# Patient Record
Sex: Male | Born: 1965 | Race: Black or African American | Hispanic: No | Marital: Single | Smoking: Never smoker
Health system: Southern US, Community
[De-identification: ages and names within clinical notes are randomized; demographics above are authoritative.]

## PROBLEM LIST (undated history)

## (undated) DIAGNOSIS — E7800 Pure hypercholesterolemia, unspecified: Secondary | ICD-10-CM

## (undated) DIAGNOSIS — Z9289 Personal history of other medical treatment: Secondary | ICD-10-CM

## (undated) DIAGNOSIS — M109 Gout, unspecified: Secondary | ICD-10-CM

## (undated) DIAGNOSIS — I10 Essential (primary) hypertension: Secondary | ICD-10-CM

## (undated) HISTORY — PX: HERNIA REPAIR: SHX51

---

## 2008-02-21 ENCOUNTER — Ambulatory Visit: Payer: Self-pay | Admitting: Cardiology

## 2017-06-15 DIAGNOSIS — Z9289 Personal history of other medical treatment: Secondary | ICD-10-CM

## 2017-06-15 HISTORY — DX: Personal history of other medical treatment: Z92.89

## 2018-06-27 ENCOUNTER — Encounter (HOSPITAL_COMMUNITY): Payer: Self-pay

## 2018-06-27 ENCOUNTER — Other Ambulatory Visit: Payer: Self-pay

## 2018-06-27 ENCOUNTER — Emergency Department (HOSPITAL_COMMUNITY): Payer: BLUE CROSS/BLUE SHIELD

## 2018-06-27 ENCOUNTER — Emergency Department (HOSPITAL_COMMUNITY)
Admission: EM | Admit: 2018-06-27 | Discharge: 2018-06-28 | Disposition: A | Discharge status: Home / Self Care | Payer: BLUE CROSS/BLUE SHIELD | Attending: Emergency Medicine | Admitting: Emergency Medicine

## 2018-06-27 DIAGNOSIS — Z7982 Long term (current) use of aspirin: Secondary | ICD-10-CM | POA: Insufficient documentation

## 2018-06-27 DIAGNOSIS — R112 Nausea with vomiting, unspecified: Secondary | ICD-10-CM | POA: Diagnosis present

## 2018-06-27 DIAGNOSIS — I10 Essential (primary) hypertension: Secondary | ICD-10-CM

## 2018-06-27 DIAGNOSIS — Z79899 Other long term (current) drug therapy: Secondary | ICD-10-CM | POA: Insufficient documentation

## 2018-06-27 HISTORY — DX: Pure hypercholesterolemia, unspecified: E78.00

## 2018-06-27 HISTORY — DX: Essential (primary) hypertension: I10

## 2018-06-27 HISTORY — DX: Personal history of other medical treatment: Z92.89

## 2018-06-27 HISTORY — DX: Gout, unspecified: M10.9

## 2018-06-27 LAB — COMPREHENSIVE METABOLIC PANEL WITH GFR
ALT: 17 U/L (ref 0–44)
AST: 26 U/L (ref 15–41)
Albumin: 4.5 g/dL (ref 3.5–5.0)
Alkaline Phosphatase: 53 U/L (ref 38–126)
Anion gap: 10 (ref 5–15)
BUN: 6 mg/dL (ref 6–20)
CO2: 22 mmol/L (ref 22–32)
Calcium: 9.4 mg/dL (ref 8.9–10.3)
Chloride: 107 mmol/L (ref 98–111)
Creatinine, Ser: 1.27 mg/dL — ABNORMAL HIGH (ref 0.61–1.24)
GFR calc Af Amer: >60 mL/min
GFR calc non Af Amer: >60 mL/min
Glucose, Bld: 139 mg/dL — ABNORMAL HIGH (ref 70–99)
Potassium: 4.1 mmol/L (ref 3.5–5.1)
Sodium: 139 mmol/L (ref 135–145)
Total Bilirubin: 0.3 mg/dL (ref 0.3–1.2)
Total Protein: 8 g/dL (ref 6.5–8.1)

## 2018-06-27 LAB — CBC
HCT: 45.9 % (ref 39.0–52.0)
Hemoglobin: 14.2 g/dL (ref 13.0–17.0)
MCH: 27 pg (ref 26.0–34.0)
MCHC: 30.9 g/dL (ref 30.0–36.0)
MCV: 87.3 fL (ref 80.0–100.0)
Platelets: 370 K/uL (ref 150–400)
RBC: 5.26 MIL/uL (ref 4.22–5.81)
RDW: 14.5 % (ref 11.5–15.5)
WBC: 6.8 K/uL (ref 4.0–10.5)
nRBC: 0 % (ref 0.0–0.2)

## 2018-06-27 LAB — URINALYSIS, ROUTINE W REFLEX MICROSCOPIC
Bilirubin Urine: NEGATIVE
Glucose, UA: NEGATIVE mg/dL
HGB URINE DIPSTICK: NEGATIVE
Ketones, ur: NEGATIVE mg/dL
Leukocytes,Ua: NEGATIVE
Nitrite: NEGATIVE
PROTEIN: NEGATIVE mg/dL
SPECIFIC GRAVITY, URINE: 1.023 (ref 1.005–1.030)
pH: 5 (ref 5.0–8.0)

## 2018-06-27 LAB — LIPASE, BLOOD: Lipase: 31 U/L (ref 11–51)

## 2018-06-27 LAB — TROPONIN I: Troponin I: <0.03 ng/mL

## 2018-06-27 MED ORDER — FAMOTIDINE IN NACL 20-0.9 MG/50ML-% IV SOLN
20.0000 mg | Freq: Once | INTRAVENOUS | Status: AC
  Administered 2018-06-27: 20 mg via INTRAVENOUS
  Filled 2018-06-27: qty 50

## 2018-06-27 MED ORDER — ONDANSETRON HCL 4 MG/2ML IJ SOLN
4.0000 mg | INTRAMUSCULAR | Status: DC | PRN
  Administered 2018-06-27: 4 mg via INTRAVENOUS
  Filled 2018-06-27: qty 2

## 2018-06-27 MED ORDER — CARVEDILOL 12.5 MG PO TABS
25.0000 mg | ORAL_TABLET | Freq: Once | ORAL | Status: AC
  Administered 2018-06-27: 25 mg via ORAL
  Filled 2018-06-27: qty 2

## 2018-06-27 MED ORDER — SODIUM CHLORIDE 0.9 % IV BOLUS
1000.0000 mL | Freq: Once | INTRAVENOUS | Status: AC
  Administered 2018-06-27: 1000 mL via INTRAVENOUS

## 2018-06-27 MED ORDER — SODIUM CHLORIDE 0.9% FLUSH: 3.0000 mL | Freq: Once | INTRAVENOUS | Status: DC

## 2018-06-27 MED ORDER — DOXAZOSIN MESYLATE 2 MG PO TABS
2.0000 mg | ORAL_TABLET | Freq: Once | ORAL | Status: AC
  Administered 2018-06-27: 2 mg via ORAL
  Filled 2018-06-27: qty 1

## 2018-06-27 MED ORDER — CLONIDINE HCL 0.1 MG PO TABS
0.1000 mg | ORAL_TABLET | Freq: Once | ORAL | Status: AC
  Administered 2018-06-27: 0.1 mg via ORAL
  Filled 2018-06-27: qty 1

## 2018-06-27 NOTE — ED Provider Notes (Signed)
[facility] EMERGENCY DEPARTMENT Provider Note   CSN: [id number] Arrival date & time: 06/27/18  1756    History   Chief Complaint Chief Complaint  Patient presents with  . Emesis    HPI Gabriel Zavala is a 53 y.o. male.     HPI  Pt was seen at 2105. Per pt, c/o gradual onset and persistence of multiple intermittent episodes of N/V that began early this morning approximately 0800. Pt unable to take his meds today due to N/V. Denies diarrhea, no abd pain, no CP/SOB, no back pain, no fevers, no black or blood in stools or emesis.    Past Medical History:  Diagnosis Date  . Gout   . Hypercholesteremia   . Hypertension     There are no active problems to display for this patient.   Past Surgical History:  Procedure Laterality Date  . HERNIA REPAIR          Home Medications    Prior to Admission medications   Medication Sig Start Date End Date Taking? Authorizing Provider  allopurinol (ZYLOPRIM) 300 MG tablet Take 300 mg by mouth daily.    Yes [provider]  amLODipine-olmesartan (AZOR) 10-40 MG tablet Take 1 tablet by mouth daily.    Yes [provider]  aspirin EC 81 MG tablet Take 81 mg by mouth daily.   Yes [provider]  carvedilol (COREG) 25 MG tablet Take 25 mg by mouth 2 (two) times daily with a meal.    Yes [provider]  cloNIDine (CATAPRES) 0.1 MG tablet Take 0.1 mg by mouth 2 (two) times daily.   Yes [provider]  doxazosin (CARDURA) 2 MG tablet Take 2 mg by mouth 2 (two) times daily.    Yes [provider]  fenofibrate (TRICOR) 145 MG tablet Take 145 mg by mouth daily.    Yes [provider]  Multiple Vitamin (MULTIVITAMIN WITH MINERALS) TABS tablet Take 1 tablet by mouth daily.   Yes [provider]  oxymetazoline (AFRIN) 0.05 % nasal spray Place 1 spray into both nostrils 2 (two) times daily as needed for congestion.   Yes [provider]   tetrahydrozoline-zinc (VISINE-AC) 0.05-0.25 % ophthalmic solution Place 2 drops into both eyes 3 (three) times daily as needed (for dry eye).   Yes [provider]    Family History No family history on file.  Social History Social History   Tobacco Use  . Smoking status: Never Smoker  . Smokeless tobacco: Never Used  Substance Use Topics  . Alcohol use: Yes    Comment: occ  . Drug use: Not Currently     Allergies   Patient has no allergy information on record.   Review of Systems Review of Systems ROS: Statement: All systems negative except as marked or noted in the HPI; Constitutional: Negative for fever and chills. ; ; Eyes: Negative for eye pain, redness and discharge. ; ; ENMT: Negative for ear pain, hoarseness, nasal congestion, sinus pressure and sore throat. ; ; Cardiovascular: Negative for chest pain, palpitations, diaphoresis, dyspnea and peripheral edema. ; ; Respiratory: Negative for cough, wheezing and stridor. ; ; Gastrointestinal: +N/V. Negative for diarrhea, abdominal pain, blood in stool, hematemesis, jaundice and rectal bleeding. . ; ; Genitourinary: Negative for dysuria, flank pain and hematuria. ; ; Musculoskeletal: Negative for back pain and neck pain. Negative for swelling and trauma.; ; Skin: Negative for pruritus, rash, abrasions, blisters, bruising and skin lesion.; ; Neuro: Negative for  headache, lightheadedness and neck stiffness. Negative for weakness, altered level of consciousness, altered mental status, extremity weakness, paresthesias, involuntary movement, seizure and syncope.       Physical Exam Updated Vital Signs BP (!) 188/96   Pulse 93   Temp 98.7 F (37.1 C) (Oral)   Resp 20   Ht  (1.753 m)   Wt 108.9 kg   SpO2 96%   BMI 35.44 kg/m   Physical Exam  2110: Physical examination:  Nursing notes reviewed; Vital signs and O2 SAT reviewed;  Constitutional: Well developed, Well nourished, Well hydrated, In no acute distress;  Head:  Normocephalic, atraumatic; Eyes: EOMI, PERRL, No scleral icterus; ENMT: Mouth and pharynx normal, Mucous membranes moist; Neck: Supple, Full range of motion, No lymphadenopathy; Cardiovascular: Regular rate and rhythm, No gallop; Respiratory: Breath sounds clear & equal bilaterally, No wheezes.  Speaking full sentences with ease, Normal respiratory effort/excursion; Chest: Nontender, Movement normal; Abdomen: Soft, Nontender, Nondistended, Normal bowel sounds; Genitourinary: No CVA tenderness; Extremities: Peripheral pulses normal, No tenderness, No edema, No calf edema or asymmetry.; Neuro: AA&Ox3, Major CN grossly intact.  Speech clear. No gross focal motor or sensory deficits in extremities.; Skin: Color normal, Warm, Dry.   ED Treatments / Results  Labs (all labs ordered are listed, but only abnormal results are displayed)   EKG EKG Interpretation  Date/Time:  Thursday June 27 2018 21:25:43 EDT Ventricular Rate:  78 PR Interval:    QRS Duration: 93 QT Interval:  403 QTC Calculation: 459 R Axis:     Text Interpretation:  Sinus rhythm Borderline T wave abnormalities Baseline wander Artifact No old tracing to compare Confirmed by Samuel Jester [id number]) on 06/27/2018 9:35:16 PM   Radiology   Procedures Procedures (including critical care time)  Medications Ordered in ED Medications  sodium chloride flush (NS) 0.9 % injection 3 mL (3 mLs Intravenous Not Given 06/27/18 2048)  sodium chloride 0.9 % bolus 1,000 mL (has no administration in time range)  ondansetron (ZOFRAN) injection 4 mg (has no administration in time range)  famotidine (PEPCID) IVPB 20 mg premix (has no administration in time range)     Initial Impression / Assessment and Plan / ED Course  I have reviewed the triage vital signs and the nursing notes.  Pertinent labs & imaging results that were available during my care of the patient were reviewed by me and considered in my medical decision making (see  chart for details).     MDM Reviewed: previous chart, nursing note and vitals Reviewed previous: labs and ECG Interpretation: labs, ECG and x-ray   Results for orders placed or performed during the hospital encounter of 06/27/18  Lipase, blood  Result Value Ref Range   Lipase 31 11 - 51 U/L  Comprehensive metabolic panel  Result Value Ref Range   Sodium 139 135 - 145 mmol/L   Potassium 4.1 3.5 - 5.1 mmol/L   Chloride 107 98 - 111 mmol/L   CO2 22 22 - 32 mmol/L   Glucose, Bld 139 (H) 70 - 99 mg/dL   BUN 6 6 - 20 mg/dL   Creatinine, Ser 7.84 (H) 0.61 - 1.24 mg/dL   Calcium 9.4 8.9 - 69.6 mg/dL   Total Protein 8.0 6.5 - 8.1 g/dL   Albumin 4.5 3.5 - 5.0 g/dL   AST 26 15 - 41 U/L   ALT 17 0 - 44 U/L   Alkaline Phosphatase 53 38 - 126 U/L   Total Bilirubin 0.3 0.3 - 1.2 mg/dL  GFR calc non Af Amer >60 >60 mL/min   GFR calc Af Amer >60 >60 mL/min   Anion gap 10 5 - 15  CBC  Result Value Ref Range   WBC 6.8 4.0 - 10.5 K/uL   RBC 5.26 4.22 - 5.81 MIL/uL   Hemoglobin 14.2 13.0 - 17.0 g/dL   HCT 59.1 63.8 - 46.6 %   MCV 87.3 80.0 - 100.0 fL   MCH 27.0 26.0 - 34.0 pg   MCHC 30.9 30.0 - 36.0 g/dL   RDW 59.9 35.7 - 01.7 %   Platelets 370 150 - 400 K/uL   nRBC 0.0 0.0 - 0.2 %  Urinalysis, Routine w reflex microscopic  Result Value Ref Range   Color, Urine YELLOW YELLOW   APPearance CLEAR CLEAR   Specific Gravity, Urine 1.023 1.005 - 1.030   pH 5.0 5.0 - 8.0   Glucose, UA NEGATIVE NEGATIVE mg/dL   Hgb urine dipstick NEGATIVE NEGATIVE   Bilirubin Urine NEGATIVE NEGATIVE   Ketones, ur NEGATIVE NEGATIVE mg/dL   Protein, ur NEGATIVE NEGATIVE mg/dL   Nitrite NEGATIVE NEGATIVE   Leukocytes,Ua NEGATIVE NEGATIVE  Troponin I - Once  Result Value Ref Range   Troponin I <0.03 <0.03 ng/mL  Troponin I - Once  Result Value Ref Range   Troponin I <0.03 <0.03 ng/mL   Dg Abd Acute W/chest Result Date: 06/27/2018 CLINICAL DATA:  Nausea and vomiting since this morning. EXAM: DG  ABDOMEN ACUTE W/ 1V CHEST COMPARISON:  None. FINDINGS: There is no evidence of dilated bowel loops or free intraperitoneal air. Moderate stool retention within the colon. Gas-filled physiologic distention of the transverse colon. No radiopaque calculi or other significant radiographic abnormality is seen. Heart size and mediastinal contours are within normal limits. Slight uncoiling of the thoracic aorta without aneurysm. Both lungs are clear. Facet sclerosis at L5-S1 bilaterally. Mild joint space narrowing of the hips. Phleboliths are seen in the lower pelvis. IMPRESSION: Nonobstructed bowel gas pattern. Moderate stool retention within the colon. No active cardiopulmonary disease. Electronically Signed   By: Tollie Eth M.D.   On: 06/27/2018 22:46    2320:  Pt states he feels better after meds. Troponin x2 negative and EKG without acute STTW changes (no old to compare), and pt also has hx of normal NM stress test 06/2017; doubt ACS as cause for N/V. Pt has not taken any of his BP meds today. Will dose BP meds in ED as well as PO challenge. BUN/Cr within baseline range in Care Everywhere records.   0010:   No stooling while in the ED.  Abd remains benign, resps easy, VSS. Feels better and wants to go home now. Dx and testing d/w pt.  Questions answered.  Verb understanding, agreeable to d/c home with outpt f/u. Sign out to Dr. Bebe Shaggy pending PO trial.          Final Clinical Impressions(s) / ED Diagnoses   Final diagnoses:  None    ED Discharge Orders    None       Samuel Jester, DO 07/01/18 2100

## 2018-06-27 NOTE — ED Triage Notes (Signed)
Pt presents to ED with complaints of nausea and vomiting which started this morning. Pt denies abdominal pain. Pt states he has vomited x 3 in 24 hours.

## 2018-06-27 NOTE — ED Notes (Signed)
AC to bring Cardura.

## 2018-06-27 NOTE — ED Notes (Signed)
Pt states he V/ his morning BP dose, has not taken evening dose either.

## 2018-06-28 MED ORDER — ONDANSETRON 4 MG PO TBDP: 4.0000 mg | ORAL_TABLET | Freq: Three times a day (TID) | ORAL | 0 refills | Status: DC | PRN

## 2018-06-28 NOTE — Discharge Instructions (Signed)
Take your usual prescriptions as previously directed. Take the new prescription as directed. Increase your fluid intake (ie:  Gatoraide) for the next few days.  Eat a bland diet and advance to your regular diet slowly as you can tolerate it.   Call your regular medical doctor today to schedule a follow up appointment this week.  Return to the Emergency Department immediately sooner if worsening.

## 2018-06-28 NOTE — ED Provider Notes (Signed)
Blood pressure improved.  Patient is not vomiting.  Overall he feels improved.  Per recommendations of Dr. Clarene Duke, will discharge home   Gabriel Rhine, MD 06/28/18 [phone]

## 2018-10-16 ENCOUNTER — Other Ambulatory Visit: Payer: Self-pay

## 2018-10-16 ENCOUNTER — Emergency Department (HOSPITAL_COMMUNITY): Payer: BC Managed Care – PPO

## 2018-10-16 ENCOUNTER — Observation Stay (HOSPITAL_COMMUNITY)
Admission: EM | Admit: 2018-10-16 | Discharge: 2018-10-17 | Disposition: A | Discharge status: Home / Self Care | Payer: BC Managed Care – PPO | Attending: Internal Medicine | Admitting: Internal Medicine

## 2018-10-16 ENCOUNTER — Encounter (HOSPITAL_COMMUNITY): Payer: Self-pay | Admitting: *Deleted

## 2018-10-16 ENCOUNTER — Observation Stay (HOSPITAL_COMMUNITY): Payer: BC Managed Care – PPO

## 2018-10-16 DIAGNOSIS — Z79899 Other long term (current) drug therapy: Secondary | ICD-10-CM | POA: Diagnosis not present

## 2018-10-16 DIAGNOSIS — G459 Transient cerebral ischemic attack, unspecified: Secondary | ICD-10-CM | POA: Diagnosis not present

## 2018-10-16 DIAGNOSIS — Z7982 Long term (current) use of aspirin: Secondary | ICD-10-CM | POA: Diagnosis not present

## 2018-10-16 DIAGNOSIS — R739 Hyperglycemia, unspecified: Secondary | ICD-10-CM | POA: Diagnosis present

## 2018-10-16 DIAGNOSIS — R569 Unspecified convulsions: Secondary | ICD-10-CM | POA: Diagnosis present

## 2018-10-16 DIAGNOSIS — N179 Acute kidney failure, unspecified: Secondary | ICD-10-CM | POA: Diagnosis present

## 2018-10-16 DIAGNOSIS — E876 Hypokalemia: Secondary | ICD-10-CM | POA: Diagnosis present

## 2018-10-16 DIAGNOSIS — Z20828 Contact with and (suspected) exposure to other viral communicable diseases: Secondary | ICD-10-CM | POA: Insufficient documentation

## 2018-10-16 DIAGNOSIS — D72829 Elevated white blood cell count, unspecified: Secondary | ICD-10-CM | POA: Diagnosis not present

## 2018-10-16 DIAGNOSIS — I10 Essential (primary) hypertension: Secondary | ICD-10-CM | POA: Diagnosis not present

## 2018-10-16 DIAGNOSIS — R17 Unspecified jaundice: Secondary | ICD-10-CM | POA: Diagnosis present

## 2018-10-16 HISTORY — DX: Unspecified convulsions: R56.9

## 2018-10-16 LAB — DIFFERENTIAL
Abs Immature Granulocytes: 0.17 10*3/uL — ABNORMAL HIGH (ref 0.00–0.07)
Basophils Absolute: 0.1 10*3/uL (ref 0.0–0.1)
Basophils Relative: 1 %
Eosinophils Absolute: 0.2 10*3/uL (ref 0.0–0.5)
Eosinophils Relative: 1 %
Immature Granulocytes: 1 %
Lymphocytes Relative: 43 %
Lymphs Abs: 5.6 10*3/uL — ABNORMAL HIGH (ref 0.7–4.0)
Monocytes Absolute: 1.2 10*3/uL — ABNORMAL HIGH (ref 0.1–1.0)
Monocytes Relative: 9 %
Neutro Abs: 5.9 10*3/uL (ref 1.7–7.7)
Neutrophils Relative %: 45 %

## 2018-10-16 LAB — COMPREHENSIVE METABOLIC PANEL
ALT: <5 U/L (ref 0–44)
AST: 99 U/L — ABNORMAL HIGH (ref 15–41)
Albumin: 4.1 g/dL (ref 3.5–5.0)
Alkaline Phosphatase: 78 U/L (ref 38–126)
Anion gap: >20 — ABNORMAL HIGH (ref 5–15)
BUN: 10 mg/dL (ref 6–20)
CO2: 15 mmol/L — ABNORMAL LOW (ref 22–32)
Calcium: 9 mg/dL (ref 8.9–10.3)
Chloride: 96 mmol/L — ABNORMAL LOW (ref 98–111)
Creatinine, Ser: 1.98 mg/dL — ABNORMAL HIGH (ref 0.61–1.24)
GFR calc Af Amer: 43 mL/min — ABNORMAL LOW (ref >60)
GFR calc non Af Amer: 37 mL/min — ABNORMAL LOW (ref >60)
Glucose, Bld: 156 mg/dL — ABNORMAL HIGH (ref 70–99)
Potassium: 2.8 mmol/L — ABNORMAL LOW (ref 3.5–5.1)
Sodium: 137 mmol/L (ref 135–145)
Total Bilirubin: 1.5 mg/dL — ABNORMAL HIGH (ref 0.3–1.2)
Total Protein: 7.8 g/dL (ref 6.5–8.1)

## 2018-10-16 LAB — BASIC METABOLIC PANEL
Anion gap: 18 — ABNORMAL HIGH (ref 5–15)
BUN: 10 mg/dL (ref 6–20)
CO2: 21 mmol/L — ABNORMAL LOW (ref 22–32)
Calcium: 8.8 mg/dL — ABNORMAL LOW (ref 8.9–10.3)
Chloride: 96 mmol/L — ABNORMAL LOW (ref 98–111)
Creatinine, Ser: 1.45 mg/dL — ABNORMAL HIGH (ref 0.61–1.24)
GFR calc Af Amer: >60 mL/min (ref >60)
GFR calc non Af Amer: 54 mL/min — ABNORMAL LOW (ref >60)
Glucose, Bld: 111 mg/dL — ABNORMAL HIGH (ref 70–99)
Potassium: 3 mmol/L — ABNORMAL LOW (ref 3.5–5.1)
Sodium: 135 mmol/L (ref 135–145)

## 2018-10-16 LAB — CBC
HCT: 46.8 % (ref 39.0–52.0)
Hemoglobin: 14.7 g/dL (ref 13.0–17.0)
MCH: 29.1 pg (ref 26.0–34.0)
MCHC: 31.4 g/dL (ref 30.0–36.0)
MCV: 92.5 fL (ref 80.0–100.0)
Platelets: 240 10*3/uL (ref 150–400)
RBC: 5.06 MIL/uL (ref 4.22–5.81)
RDW: 13.7 % (ref 11.5–15.5)
WBC: 13.2 10*3/uL — ABNORMAL HIGH (ref 4.0–10.5)
nRBC: 0.3 % — ABNORMAL HIGH (ref 0.0–0.2)

## 2018-10-16 LAB — URINALYSIS, ROUTINE W REFLEX MICROSCOPIC
Glucose, UA: NEGATIVE mg/dL
Hgb urine dipstick: NEGATIVE
Ketones, ur: NEGATIVE mg/dL
Leukocytes,Ua: NEGATIVE
Nitrite: NEGATIVE
Protein, ur: >=300 mg/dL — AB
Specific Gravity, Urine: 1.031 — ABNORMAL HIGH (ref 1.005–1.030)
pH: 5 (ref 5.0–8.0)

## 2018-10-16 LAB — RAPID URINE DRUG SCREEN, HOSP PERFORMED
Amphetamines: NOT DETECTED
Barbiturates: NOT DETECTED
Benzodiazepines: NOT DETECTED
Cocaine: NOT DETECTED
Opiates: NOT DETECTED
Tetrahydrocannabinol: NOT DETECTED

## 2018-10-16 LAB — PROTIME-INR
INR: 1.2 (ref 0.8–1.2)
Prothrombin Time: 14.6 seconds (ref 11.4–15.2)

## 2018-10-16 LAB — ETHANOL: Alcohol, Ethyl (B): <10 mg/dL (ref <10)

## 2018-10-16 LAB — GLUCOSE, CAPILLARY: Glucose-Capillary: 156 mg/dL — ABNORMAL HIGH (ref 70–99)

## 2018-10-16 LAB — APTT: aPTT: 26 seconds (ref 24–36)

## 2018-10-16 LAB — SARS CORONAVIRUS 2 BY RT PCR (HOSPITAL ORDER, PERFORMED IN ~~LOC~~ HOSPITAL LAB): SARS Coronavirus 2: NEGATIVE

## 2018-10-16 LAB — MAGNESIUM: Magnesium: 1.9 mg/dL (ref 1.7–2.4)

## 2018-10-16 MED ORDER — LORAZEPAM 2 MG/ML IJ SOLN: 2.0000 mg | INTRAMUSCULAR | Status: DC | PRN

## 2018-10-16 MED ORDER — ALLOPURINOL 300 MG PO TABS
300.0000 mg | ORAL_TABLET | Freq: Every day | ORAL | Status: DC
  Administered 2018-10-16 – 2018-10-17 (×2): 300 mg via ORAL
  Filled 2018-10-16 (×2): qty 1

## 2018-10-16 MED ORDER — IRBESARTAN 150 MG PO TABS: 300.0000 mg | ORAL_TABLET | Freq: Every day | ORAL | Status: DC

## 2018-10-16 MED ORDER — FENOFIBRATE 160 MG PO TABS
160.0000 mg | ORAL_TABLET | Freq: Every day | ORAL | Status: DC
  Administered 2018-10-17: 10:00:00 160 mg via ORAL
  Filled 2018-10-16: qty 1

## 2018-10-16 MED ORDER — SODIUM CHLORIDE 0.9 % IV SOLN: INTRAVENOUS | Status: DC | PRN

## 2018-10-16 MED ORDER — ACETAMINOPHEN 160 MG/5ML PO SOLN: 650.0000 mg | ORAL | Status: DC | PRN

## 2018-10-16 MED ORDER — AMLODIPINE BESYLATE 5 MG PO TABS: 10.0000 mg | ORAL_TABLET | Freq: Every day | ORAL | Status: DC

## 2018-10-16 MED ORDER — ACETAMINOPHEN 650 MG RE SUPP: 650.0000 mg | RECTAL | Status: DC | PRN

## 2018-10-16 MED ORDER — POTASSIUM CHLORIDE CRYS ER 20 MEQ PO TBCR
20.0000 meq | EXTENDED_RELEASE_TABLET | Freq: Once | ORAL | Status: AC
  Administered 2018-10-16: 20 meq via ORAL
  Filled 2018-10-16: qty 1

## 2018-10-16 MED ORDER — ASPIRIN EC 81 MG PO TBEC
81.0000 mg | DELAYED_RELEASE_TABLET | Freq: Every day | ORAL | Status: DC
  Administered 2018-10-16 – 2018-10-17 (×2): 81 mg via ORAL
  Filled 2018-10-16 (×2): qty 1

## 2018-10-16 MED ORDER — MAGNESIUM SULFATE 2 GM/50ML IV SOLN
2.0000 g | Freq: Once | INTRAVENOUS | Status: AC
  Administered 2018-10-16: 17:00:00 2 g via INTRAVENOUS
  Filled 2018-10-16: qty 50

## 2018-10-16 MED ORDER — ENOXAPARIN SODIUM 40 MG/0.4ML ~~LOC~~ SOLN
40.0000 mg | SUBCUTANEOUS | Status: DC
  Administered 2018-10-17: 40 mg via SUBCUTANEOUS
  Filled 2018-10-16: qty 0.4

## 2018-10-16 MED ORDER — AMLODIPINE-OLMESARTAN 10-40 MG PO TABS: 1.0000 | ORAL_TABLET | Freq: Every day | ORAL | Status: DC

## 2018-10-16 MED ORDER — STROKE: EARLY STAGES OF RECOVERY BOOK
Freq: Once | Status: AC
  Administered 2018-10-16: 21:00:00
  Filled 2018-10-16: qty 1

## 2018-10-16 MED ORDER — CLONIDINE HCL 0.1 MG PO TABS
0.1000 mg | ORAL_TABLET | Freq: Two times a day (BID) | ORAL | Status: DC
  Administered 2018-10-16: 0.1 mg via ORAL
  Filled 2018-10-16: qty 1

## 2018-10-16 MED ORDER — POTASSIUM CHLORIDE CRYS ER 20 MEQ PO TBCR
40.0000 meq | EXTENDED_RELEASE_TABLET | Freq: Once | ORAL | Status: AC
  Administered 2018-10-16: 17:00:00 40 meq via ORAL
  Filled 2018-10-16: qty 2

## 2018-10-16 MED ORDER — POTASSIUM CHLORIDE IN NACL 20-0.9 MEQ/L-% IV SOLN
INTRAVENOUS | Status: AC
  Administered 2018-10-16: 17:00:00 via INTRAVENOUS
  Filled 2018-10-16: qty 1000

## 2018-10-16 MED ORDER — CARVEDILOL 12.5 MG PO TABS
25.0000 mg | ORAL_TABLET | Freq: Two times a day (BID) | ORAL | Status: DC
  Administered 2018-10-16: 25 mg via ORAL
  Filled 2018-10-16 (×2): qty 2

## 2018-10-16 MED ORDER — POTASSIUM CHLORIDE IN NACL 40-0.9 MEQ/L-% IV SOLN
INTRAVENOUS | Status: AC
  Administered 2018-10-16: 125 mL/h via INTRAVENOUS

## 2018-10-16 MED ORDER — DOXAZOSIN MESYLATE 2 MG PO TABS
2.0000 mg | ORAL_TABLET | Freq: Two times a day (BID) | ORAL | Status: DC
  Administered 2018-10-16: 21:00:00 2 mg via ORAL
  Filled 2018-10-16: qty 1

## 2018-10-16 MED ORDER — POTASSIUM CHLORIDE 10 MEQ/100ML IV SOLN: 10.0000 meq | Freq: Once | INTRAVENOUS | Status: DC

## 2018-10-16 MED ORDER — ACETAMINOPHEN 325 MG PO TABS
650.0000 mg | ORAL_TABLET | ORAL | Status: DC | PRN
  Administered 2018-10-16: 650 mg via ORAL
  Filled 2018-10-16: qty 2

## 2018-10-16 MED ORDER — ENOXAPARIN SODIUM 40 MG/0.4ML ~~LOC~~ SOLN: 40.0000 mg | SUBCUTANEOUS | Status: DC

## 2018-10-16 NOTE — Consult Note (Signed)
[facility] Tele[facility]urology Consult Services   Date of Service:   10/16/2018 15:06:23  Impression:     .  New onset seizure with probable post ictal state.  Stroke less likely, rule out  Comments/Sign-Out: 53 yo M with new onset seizure and right sided deficits thereafter, mostly resolved except for mild facial asymmetry. Stroke cannot be excluded but less likely No tPA indicated.   Recommend admission for MRI brain wwo contrast, EEG and neuro consult. No indication for AED unless he has another event  Metrics: Last Known Well: 10/16/2018 14:40:00 [facility] Notification Time: 10/16/2018 15:06:23 Arrival Time: 10/16/2018 15:02:00 Stamp Time: 10/16/2018 15:06:23 Time First Login Attempt: 10/16/2018 15:08:31 Video Start Time: 10/16/2018 15:14:54  Symptoms: seizure, right sided deficits NIHSS Start Assessment Time: 10/16/2018 15:18:01 Patient is not a candidate for tPA. Patient was not deemed candidate for tPA thrombolytics because of Resolved symptoms (no residual disabling symptoms). Video End Time: 10/16/2018 15:23:30  CT head showed no acute hemorrhage or acute core infarct.  Clinical Presentation is not Suggestive of Large Vessel Occlusive Disease  ED Physician notified of diagnostic impression and management plan on 10/16/2018 15:23:32  Our recommendations are outlined below.  Recommendations:     .  Activate Stroke Protocol Admission/Order Set     .  Stroke/Telemetry Floor     .  Neuro Checks     .  Bedside Swallow Eval     .  DVT Prophylaxis     .  IV Fluids, Normal Saline     .  Head of Bed 30 Degrees     .  Euglycemia and Avoid Hyperthermia (PRN Acetaminophen)  Routine Consultation with [facility] for Follow up Care  Sign Out:     .  Discussed with Emergency Department Provider    ------------------------------------------------------------------------------  History of Present Illness: Patient is a 54 year old Male.  who  had a seizure and jerking per family now with right sided facial droop and slurred speech. No alcoohol or drug use. Brother has history of seizures but it was due to trauma. No recent illness   Currently back to normal except right lower lip still asymmetric  Last seen normal was within 4.5 hours. There is no history of hemorrhagic complications or intracranial hemorrhage. There is no history of Recent Anticoagulants. There is no history of recent major surgery. There is no history of recent stroke.  Examination: BP(155/90), Blood Glucose(156) 1A: Level of Consciousness - Alert; keenly responsive + 0 1B: Ask Month and Age - Both Questions Right + 0 1C: Blink Eyes & Squeeze Hands - Performs Both Tasks + 0 2: Test Horizontal Extraocular Movements - Normal + 0 3: Test Visual Fields - No Visual Loss + 0 4: Test Facial Palsy (Use Grimace if Obtunded) - Minor paralysis (flat nasolabial fold, smile asymmetry) + 1 5A: Test Left Arm Motor Drift - No Drift for 10 Seconds + 0 5B: Test Right Arm Motor Drift - No Drift for 10 Seconds + 0 6A: Test Left Leg Motor Drift - No Drift for 5 Seconds + 0 6B: Test Right Leg Motor Drift - No Drift for 5 Seconds + 0 7: Test Limb Ataxia (FNF/Heel-Shin) - No Ataxia + 0 8: Test Sensation - Normal; No sensory loss + 0 9: Test Language/Aphasia - Normal; No aphasia + 0 10: Test Dysarthria - Normal + 0 11: Test Extinction/Inattention - No abnormality + 0  NIHSS Score: 1  Patient/Family was informed the Neurology Consult would happen via TeleHealth  consult by way of interactive audio and video telecommunications and consented to receiving care in this manner.  Due to the immediate potential for life-threatening deterioration due to underlying acute neurologic illness, I spent 35 minutes providing critical care. This time includes time for face to face visit via telemedicine, review of medical records, imaging studies and discussion of findings with providers, the  patient and/or family.   Dr Schuyler AmorSam Betzaida Cremeens   [facility] [phone]   Case [id number]

## 2018-10-16 NOTE — H&P (Signed)
History and Physical    Gabriel HaggardWillie Davis Zavala ZOX:[id number] DOB: 09/06/1965 DOA: 10/16/2018  PCP: Gabriel SpeckingVyas, Dhruv B, MD   Patient coming from: Home.  I have personally briefly reviewed patient[facility]  Chief Complaint: Seizure.  HPI: Gabriel Zavala is a 53 y.o. male with medical history significant of gout, hyperlipidemia, hypertension who is brought to the emergency department due to having generalized seizure at home.  He has no previous history of convulsions.  The patient denies having any aura symptoms.  The next thing he remembers after his seizure is getting off the ambulance at the hospital.  When he woke up, the patient had left-sided hemiparesis which subsequently resolved after he arrived to the ED.  He denies headache, blurred vision, slurred speech or gait abnormality.  He denies fever, chills, rhinorrhea, sore throat, dyspnea, wheezing, chest pain, palpitations, diaphoresis, dizziness, PND, orthopnea or pitting edema of the lower extremities.  No dysuria, frequency or hematuria.  Denies polyuria, polydipsia, polyphagia or blurred vision.  ED Course: Initial vital signs temperature 99.2 F, pulse 116, respirations 22, blood pressure 155/94 mmHg.  The patient received 40 mEq of KCl p.o. x1.  I added 2 g of magnesium sulfate due to borderline prolonged QT on EKG.  Urinalysis showed moderate bilirubin, proteinuria more than 300 mg/dL and rare bacteria microscopic examination.  UDS was negative.  CBC showed a white count of 13.2 hemoglobin 14.7 g/dL and platelets 811240.  PT/INR and PTT were normal.  Alcohol was normal.  Sodium 137, potassium 2.8, chloride 96 and CO2 15 mmol/L.  Glucose 156, BUN 10 and creatinine 1.9 ADA milligrams per deciliter.  AST was 99 and total bilirubin 1.5, the rest of the LFTs are within normal limits.  Review of Systems: As per HPI otherwise 10 point review of systems negative.   Past Medical History:  Diagnosis Date  .  Gout   . History of nuclear stress test 06/2017   "No evidence of pharmacologically induced myocardial ischemia or prior MI"  . Hypercholesteremia   . Hypertension   . Seizure (HCC) 10/16/2018    Past Surgical History:  Procedure Laterality Date  . HERNIA REPAIR       reports that he has never smoked. He has never used smokeless tobacco. He reports current alcohol use. He reports previous drug use.  No Known Allergies  Family medical history Hypertension mother and several siblings. Seizure disorder secondary to TBI on brother.  Prior to Admission medications   Medication Sig Start Date End Date Taking? Authorizing Provider  allopurinol (ZYLOPRIM) 300 MG tablet Take 300 mg by mouth daily.    Yes [provider]  amLODipine-olmesartan (AZOR) 10-40 MG tablet Take 1 tablet by mouth daily.    Yes [provider]  aspirin EC 81 MG tablet Take 81 mg by mouth daily.   Yes [provider]  carvedilol (COREG) 25 MG tablet Take 25 mg by mouth 2 (two) times daily with a meal.    Yes [provider]  cloNIDine (CATAPRES) 0.1 MG tablet Take 0.1 mg by mouth 2 (two) times daily.   Yes [provider]  doxazosin (CARDURA) 2 MG tablet Take 2 mg by mouth 2 (two) times daily.    Yes [provider]  fenofibrate (TRICOR) 145 MG tablet Take 145 mg by mouth once a week.    Yes [provider]  Multiple Vitamin (MULTIVITAMIN WITH MINERALS) TABS tablet Take 1 tablet by mouth daily.  Yes [provider]  oxymetazoline (AFRIN) 0.05 % nasal spray Place 1 spray into both nostrils 2 (two) times daily as needed for congestion.   Yes [provider]  tetrahydrozoline-zinc (VISINE-AC) 0.05-0.25 % ophthalmic solution Place 2 drops into both eyes 3 (three) times daily as needed (for dry eye).   Yes [provider]    Physical Exam: Vitals:   10/16/18 1530 10/16/18 1630 10/16/18 1700 10/16/18 1730  BP: (!) 142/94 (!) 162/107  (!) 180/103 (!) 184/114  Pulse:  (!) 111 (!) 109   Resp:  (!) 27 (!) 22 (!) 26  SpO2:  95% 94%   Weight:      Height:        Constitutional: NAD, calm, comfortable Eyes: PERRL, lids and conjunctivae normal ENMT: Mucous membranes are moist. Posterior pharynx clear of any exudate or lesions. Neck: normal, supple, no masses, no thyromegaly Respiratory: clear to auscultation bilaterally, no wheezing, no crackles. Normal respiratory effort. No accessory muscle use.  Cardiovascular: Regular rate and rhythm, no murmurs / rubs / gallops. No extremity edema. 2+ pedal pulses. No carotid bruits.  Abdomen: Obese, soft, no tenderness, no masses palpated. No hepatosplenomegaly. Bowel sounds positive.  Musculoskeletal: no clubbing / cyanosis. Good ROM, no contractures. Normal muscle tone.  Skin: no rashes, lesions, ulcers on limited dermatological examination. Neurologic: Minimal left facial droop, otherwise CN 2-12 grossly intact. Sensation intact, DTR normal. Strength 5/5 in all 4.  Psychiatric: Normal judgment and insight. Alert and oriented x 3. Normal mood.   Labs on Admission: I have personally reviewed following labs and imaging studies  CBC: Recent Labs  Lab 10/16/18 1518  WBC 13.2*  NEUTROABS 5.9  HGB 14.7  HCT 46.8  MCV 92.5  PLT 240   Basic Metabolic Panel: Recent Labs  Lab 10/16/18 1518  NA 137  K 2.8*  CL 96*  CO2 15*  GLUCOSE 156*  BUN 10  CREATININE 1.98*  CALCIUM 9.0  MG 1.9   GFR: Estimated Creatinine Clearance: 53 mL/min (A) (by C-G formula based on SCr of 1.98 mg/dL (H)). Liver Function Tests: Recent Labs  Lab 10/16/18 1518  AST 99*  ALT <5  ALKPHOS 78  BILITOT 1.5*  PROT 7.8  ALBUMIN 4.1   No results for input(s): LIPASE, AMYLASE in the last 168 hours. No results for input(s): AMMONIA in the last 168 hours. Coagulation Profile: Recent Labs  Lab 10/16/18 1518  INR 1.2   Cardiac Enzymes: No results for input(s): CKTOTAL, CKMB, CKMBINDEX,  TROPONINI in the last 168 hours. BNP (last 3 results) No results for input(s): PROBNP in the last 8760 hours. HbA1C: No results for input(s): HGBA1C in the last 72 hours. CBG: Recent Labs  Lab 10/16/18 1516  GLUCAP 156*   Lipid Profile: No results for input(s): CHOL, HDL, LDLCALC, TRIG, CHOLHDL, LDLDIRECT in the last 72 hours. Thyroid Function Tests: No results for input(s): TSH, T4TOTAL, FREET4, T3FREE, THYROIDAB in the last 72 hours. Anemia Panel: No results for input(s): VITAMINB12, FOLATE, FERRITIN, TIBC, IRON, RETICCTPCT in the last 72 hours. Urine analysis:    Component Value Date/Time   COLORURINE AMBER (A) 10/16/2018 1550   APPEARANCEUR CLOUDY (A) 10/16/2018 1550   LABSPEC 1.031 (H) 10/16/2018 1550   PHURINE 5.0 10/16/2018 1550   GLUCOSEU NEGATIVE 10/16/2018 1550   HGBUR NEGATIVE 10/16/2018 1550   BILIRUBINUR MODERATE (A) 10/16/2018 1550   KETONESUR NEGATIVE 10/16/2018 1550   PROTEINUR >=300 (A) 10/16/2018 1550   NITRITE NEGATIVE 10/16/2018 1550   LEUKOCYTESUR  NEGATIVE 10/16/2018 1550    Radiological Exams on Admission: Dg Chest 2 View  Result Date: 10/16/2018 CLINICAL DATA:  Seizure EXAM: CHEST - 2 VIEW COMPARISON:  February 20, 2008 FINDINGS: The heart size and mediastinal contours are within normal limits. Both lungs are clear. The visualized skeletal structures are unremarkable. IMPRESSION: No active cardiopulmonary disease. Electronically Signed   By: Constance Holster M.D.   On: 10/16/2018 18:52   Ct Head Code Stroke Wo Contrast  Result Date: 10/16/2018 CLINICAL DATA:  Code stroke. Focal neuro deficit less than 6 hours. Left facial numbness. EXAM: CT HEAD WITHOUT CONTRAST TECHNIQUE: Contiguous axial images were obtained from the base of the skull through the vertex without intravenous contrast. COMPARISON:  None. FINDINGS: Brain: Ventricle size normal. Mild atrophy. Extensive white matter disease bilaterally most likely chronic. 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 Small infarcts in the thalamus bilaterally likely chronic. Negative for acute infarct, hemorrhage, mass. Vascular: Negative for hyperdense vessel Skull: Negative Sinuses/Orbits: Negative Other: None ASPECTS ([place] Stroke Program Early CT Score) - Ganglionic level infarction (caudate, lentiform nuclei, internal capsule, insula, M1-M3 cortex): 7 - Supraganglionic infarction (M4-M6 cortex): 3 Total score (0-10 with 10 being normal): 10 IMPRESSION: 1. No acute abnormality 2. Atrophy and extensive chronic microvascular ischemia for age 20. ASPECTS is 10 4. These results were called by telephone at the time of interpretation on 10/16/2018 at 3:18 pm to Dr. Milton Ferguson , who verbally acknowledged these results. Electronically Signed   By: Franchot Gallo M.D.   On: 10/16/2018 15:18    EKG: Independently reviewed.  Sinus tachycardia Probable left atrial enlargement Abnormal R-wave progression, late transition Nonspecific T abnormalities, lateral leads Borderline prolonged QT interval Vent. rate 118 BPM PR interval * ms QRS duration 89 ms QT/QTc 345/484 ms P-R-T axes 48 -14 105  Assessment/Plan Principal Problem:   Seizure (HCC) Observation/telemetry. Frequent neuro checks. Check EEG. Check MRI of brain. TIA/CVA work-up. Consider neurology consult.  Active Problems:   TIA (transient ischemic attack) Likely a postictal reaction. However, given risk factors will check TIA/CVA work-up. Continue frequent neuro checks.    AKI (acute kidney injury) ([place]) Continue IV fluids. Monitor intake and output. Follow-up BUN, creatinine and electrolytes    Hypokalemia Replacement given. Follow-up potassium level. Magnesium was supplemented.    Elevated bilirubin Follow-up bilirubin level in the morning. Consider further work-up depending on results.    Leukocytosis Likely a stress reaction. Follow WBC in the morning.    Hyperglycemia Check hemoglobin A1c in a.m.   DVT  prophylaxis: Lovenox SQ. Code Status: Full code. Family Communication: Disposition Plan: Observation for TIA work-up and EEG. Consults called: Admission status: Inpatient/telemetry.   Reubin Milan MD [facility]  10/16/2018, 7:14 PM   This document was prepared using Dragon voice recognition software and may contain some unintended transcription errors.

## 2018-10-16 NOTE — ED Triage Notes (Signed)
LKW 1440. Pt's family stated he had "seizure like activity" Pt now has a right sided facial droop and slurred speech. In CT now.  CS called at 1501 CT called at [place] [facility] [place] [address] Roderic Palau arrived at 1500

## 2018-10-16 NOTE — ED Notes (Signed)
Patient transported to MRI 

## 2018-10-16 NOTE — Progress Notes (Signed)
CT Code stroke: Call time: 6834 Beeper time:1455 Exam started: 1502 Exam finished:1503 Images sent to [facility]: 1964 Exam completed in Epic: Finger called: 1505

## 2018-10-16 NOTE — ED Notes (Signed)
Echo at bedside

## 2018-10-16 NOTE — ED Provider Notes (Signed)
[facility] EMERGENCY DEPARTMENT Provider Note   CSN: [id number] Arrival date & time: 10/16/18  1502  An emergency department physician performed an initial assessment on this suspected stroke patient at [id number].  History   Chief Complaint Chief Complaint  Patient presents with  . Code Stroke    HPI Greggory StallionWillie Davis Seward MethBroadnax is a 53 y.o. male.     Patient has some head jerking for about 10 minutes and and slurred speech and left-sided facial drooping.  It seems to be improving now some.  When he arrived to the ER he was still having some minimal facial drooping but his speech had improved  The history is provided by the patient and the EMS personnel. No language interpreter was used.  Altered Mental Status Presenting symptoms: no behavior changes   Severity:  Moderate Most recent episode:  Today Episode history:  Single Timing:  Rare Progression:  Improving Chronicity:  New Context: alcohol use   Associated symptoms: seizures   Associated symptoms: no abdominal pain, no hallucinations, no headaches and no rash     Past Medical History:  Diagnosis Date  . Gout   . History of nuclear stress test 06/2017   "No evidence of pharmacologically induced myocardial ischemia or prior MI"  . Hypercholesteremia   . Hypertension     There are no active problems to display for this patient.   Past Surgical History:  Procedure Laterality Date  . HERNIA REPAIR          Home Medications    Prior to Admission medications   Medication Sig Start Date End Date Taking? Authorizing Provider  allopurinol (ZYLOPRIM) 300 MG tablet Take 300 mg by mouth daily.     [provider]  amLODipine-olmesartan (AZOR) 10-40 MG tablet Take 1 tablet by mouth daily.     [provider]  aspirin EC 81 MG tablet Take 81 mg by mouth daily.    [provider]  carvedilol (COREG) 25 MG tablet Take 25 mg by mouth 2 (two) times daily with a meal.     [provider]   cloNIDine (CATAPRES) 0.1 MG tablet Take 0.1 mg by mouth 2 (two) times daily.    [provider]  doxazosin (CARDURA) 2 MG tablet Take 2 mg by mouth 2 (two) times daily.     [provider]  fenofibrate (TRICOR) 145 MG tablet Take 145 mg by mouth daily.     [provider]  Multiple Vitamin (MULTIVITAMIN WITH MINERALS) TABS tablet Take 1 tablet by mouth daily.    [provider]  ondansetron (ZOFRAN ODT) 4 MG disintegrating tablet Take 1 tablet (4 mg total) by mouth every 8 (eight) hours as needed for nausea or vomiting. 06/28/18   Samuel JesterMcManus, Kathleen, DO  oxymetazoline (AFRIN) 0.05 % nasal spray Place 1 spray into both nostrils 2 (two) times daily as needed for congestion.    [provider]  tetrahydrozoline-zinc (VISINE-AC) 0.05-0.25 % ophthalmic solution Place 2 drops into both eyes 3 (three) times daily as needed (for dry eye).    [provider]    Family History No family history on file.  Social History Social History   Tobacco Use  . Smoking status: Never Smoker  . Smokeless tobacco: Never Used  Substance Use Topics  . Alcohol use: Yes    Comment: occ  . Drug use: Not Currently     Allergies   Patient has no known allergies.   Review of Systems Review of Systems  Constitutional: Negative for appetite change and fatigue.  HENT: Negative for congestion, ear discharge and sinus pressure.   Eyes: Negative for discharge.  Respiratory: Negative for cough.   Cardiovascular: Negative for chest pain.  Gastrointestinal: Negative for abdominal pain and diarrhea.  Genitourinary: Negative for frequency and hematuria.  Musculoskeletal: Negative for back pain.  Skin: Negative for rash.  Neurological: Positive for seizures. Negative for headaches.  Psychiatric/Behavioral: Negative for hallucinations.     Physical Exam Updated Vital Signs BP (!) 155/94   Pulse (!) 116   Resp (!) 22   Ht 5\' 9"  (1.753 m)   Wt 113.4 kg   SpO2  96%   BMI 36.92 kg/m   Physical Exam Vitals signs and nursing note reviewed.  Constitutional:      Appearance: He is well-developed.  HENT:     Head: Normocephalic.     Nose: Nose normal.  Eyes:     General: No scleral icterus.    Conjunctiva/sclera: Conjunctivae normal.  Neck:     Musculoskeletal: Neck supple.     Thyroid: No thyromegaly.  Cardiovascular:     Rate and Rhythm: Normal rate and regular rhythm.     Heart sounds: No murmur. No friction rub. No gallop.   Pulmonary:     Breath sounds: No stridor. No wheezing or rales.  Chest:     Chest wall: No tenderness.  Abdominal:     General: There is no distension.     Tenderness: There is no abdominal tenderness. There is no rebound.  Musculoskeletal: Normal range of motion.  Lymphadenopathy:     Cervical: No cervical adenopathy.  Skin:    Findings: No erythema or rash.  Neurological:     Mental Status: He is alert and oriented to person, place, and time.     Motor: No abnormal muscle tone.     Coordination: Coordination normal.     Comments: Mild left-sided facial drooping  Psychiatric:        Behavior: Behavior normal.      ED Treatments / Results  Labs (all labs ordered are listed, but only abnormal results are displayed) Labs Reviewed  CBC - Abnormal; Notable for the following components:      Result Value   WBC 13.2 (*)    nRBC 0.3 (*)    All other components within normal limits  DIFFERENTIAL - Abnormal; Notable for the following components:   Lymphs Abs 5.6 (*)    Monocytes Absolute 1.2 (*)    Abs Immature Granulocytes 0.17 (*)    All other components within normal limits  COMPREHENSIVE METABOLIC PANEL - Abnormal; Notable for the following components:   Potassium 2.8 (*)    Chloride 96 (*)    CO2 15 (*)    Glucose, Bld 156 (*)    Creatinine, Ser 1.98 (*)    AST 99 (*)    Total Bilirubin 1.5 (*)    GFR calc non Af Amer 37 (*)    GFR calc Af Amer 43 (*)    Anion gap >20 (*)    All other  components within normal limits  URINALYSIS, ROUTINE W REFLEX MICROSCOPIC - Abnormal; Notable for the following components:   Color, Urine AMBER (*)    APPearance CLOUDY (*)    Specific Gravity, Urine 1.031 (*)    Bilirubin Urine MODERATE (*)    Protein, ur >=300 (*)    Bacteria, UA RARE (*)    All other components within normal limits  GLUCOSE, CAPILLARY -  Abnormal; Notable for the following components:   Glucose-Capillary 156 (*)    All other components within normal limits  SARS CORONAVIRUS 2 (HOSPITAL ORDER, PERFORMED IN [facility])  ETHANOL  PROTIME-INR  APTT  RAPID URINE DRUG SCREEN, HOSP PERFORMED  I-STAT CHEM 8, ED    EKG EKG Interpretation  Date/Time:  Wednesday October 16 2018 15:20:08 EDT Ventricular Rate:  118 PR Interval:    QRS Duration: 89 QT Interval:  345 QTC Calculation: 484 R Axis:   -14 Text Interpretation:  Sinus tachycardia Probable left atrial enlargement Abnormal R-wave progression, late transition Nonspecific T abnormalities, lateral leads Borderline prolonged QT interval Confirmed by Bethann BerkshireZammit, Hardie Veltre [phone]([id number]) on 10/16/2018 4:09:44 PM   Radiology Ct Head Code Stroke Wo Contrast  Result Date: 10/16/2018 CLINICAL DATA:  Code stroke. Focal neuro deficit less than 6 hours. Left facial numbness. EXAM: CT HEAD WITHOUT CONTRAST TECHNIQUE: Contiguous axial images were obtained from the base of the skull through the vertex without intravenous contrast. COMPARISON:  None. FINDINGS: Brain: Ventricle size normal. Mild atrophy. Extensive white matter disease bilaterally most likely chronic. 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 Small infarcts in the thalamus bilaterally likely chronic. Negative for acute infarct, hemorrhage, mass. Vascular: Negative for hyperdense vessel Skull: Negative Sinuses/Orbits: Negative Other: None ASPECTS (Alberta Stroke Program Early CT Score) - Ganglionic level infarction (caudate, lentiform nuclei, internal capsule, insula,  M1-M3 cortex): 7 - Supraganglionic infarction (M4-M6 cortex): 3 Total score (0-10 with 10 being normal): 10 IMPRESSION: 1. No acute abnormality 2. Atrophy and extensive chronic microvascular ischemia for age 89. ASPECTS is 10 4. These results were called by telephone at the time of interpretation on 10/16/2018 at 3:18 pm to Dr. Bethann BerkshireJOSEPH Sung Parodi , who verbally acknowledged these results. Electronically Signed   By: Marlan Palauharles  Clark M.D.   On: 10/16/2018 15:18    Procedures Procedures (including critical care time)  Medications Ordered in ED Medications  potassium chloride SA (K-DUR) CR tablet 40 mEq (has no administration in time range)  potassium chloride 10 mEq in 100 mL IVPB (has no administration in time range)     Initial Impression / Assessment and Plan / ED Course  I have reviewed the triage vital signs and the nursing notes.  Pertinent labs & imaging results that were available during my care of the patient were reviewed by me and considered in my medical decision making (see chart for details). CRITICAL CARE Performed by: Bethann BerkshireJoseph Shyonna Carlin Total critical care time: 35 minutes Critical care time was exclusive of separately billable procedures and treating other patients. Critical care was necessary to treat or prevent imminent or life-threatening deterioration. Critical care was time spent personally by me on the following activities: development of treatment plan with patient and/or surrogate as well as nursing, discussions with consultants, evaluation of patient's response to treatment, examination of patient, obtaining history from patient or surrogate, ordering and performing treatments and interventions, ordering and review of laboratory studies, ordering and review of radiographic studies, pulse oximetry and re-evaluation of patient's condition.      Patient was seen by neurology.  Initially presented as a possible code stroke.  The neurologist decided this was seizure related and stated  the patient should be admitted to medicine with a MRI of the head with and without and an EEG    Final Clinical Impressions(s) / ED Diagnoses   Final diagnoses:  Seizure [facility](HCC)    ED Discharge Orders    None  Bethann BerkshireZammit, Kaesen Rodriguez, MD 10/16/18 [phone]

## 2018-10-17 ENCOUNTER — Observation Stay (HOSPITAL_COMMUNITY): Payer: BC Managed Care – PPO

## 2018-10-17 ENCOUNTER — Observation Stay (HOSPITAL_COMMUNITY)
Admit: 2018-10-17 | Discharge: 2018-10-17 | Disposition: A | Discharge status: Home / Self Care | Payer: BC Managed Care – PPO | Attending: Internal Medicine | Admitting: Internal Medicine

## 2018-10-17 ENCOUNTER — Observation Stay (HOSPITAL_BASED_OUTPATIENT_CLINIC_OR_DEPARTMENT_OTHER): Payer: BC Managed Care – PPO

## 2018-10-17 DIAGNOSIS — G459 Transient cerebral ischemic attack, unspecified: Secondary | ICD-10-CM | POA: Diagnosis not present

## 2018-10-17 DIAGNOSIS — R569 Unspecified convulsions: Secondary | ICD-10-CM | POA: Diagnosis not present

## 2018-10-17 DIAGNOSIS — N179 Acute kidney failure, unspecified: Secondary | ICD-10-CM | POA: Diagnosis not present

## 2018-10-17 LAB — CBC
HCT: 41.3 % (ref 39.0–52.0)
Hemoglobin: 13.2 g/dL (ref 13.0–17.0)
MCH: 29.2 pg (ref 26.0–34.0)
MCHC: 32 g/dL (ref 30.0–36.0)
MCV: 91.4 fL (ref 80.0–100.0)
Platelets: 193 10*3/uL (ref 150–400)
RBC: 4.52 MIL/uL (ref 4.22–5.81)
RDW: 13.8 % (ref 11.5–15.5)
WBC: 6.8 10*3/uL (ref 4.0–10.5)
nRBC: 0 % (ref 0.0–0.2)

## 2018-10-17 LAB — COMPREHENSIVE METABOLIC PANEL
ALT: 42 U/L (ref 0–44)
AST: 65 U/L — ABNORMAL HIGH (ref 15–41)
Albumin: 3.5 g/dL (ref 3.5–5.0)
Alkaline Phosphatase: 60 U/L (ref 38–126)
Anion gap: 11 (ref 5–15)
BUN: 11 mg/dL (ref 6–20)
CO2: 24 mmol/L (ref 22–32)
Calcium: 8.7 mg/dL — ABNORMAL LOW (ref 8.9–10.3)
Chloride: 102 mmol/L (ref 98–111)
Creatinine, Ser: 1.49 mg/dL — ABNORMAL HIGH (ref 0.61–1.24)
GFR calc Af Amer: >60 mL/min (ref >60)
GFR calc non Af Amer: 52 mL/min — ABNORMAL LOW (ref >60)
Glucose, Bld: 128 mg/dL — ABNORMAL HIGH (ref 70–99)
Potassium: 3.5 mmol/L (ref 3.5–5.1)
Sodium: 137 mmol/L (ref 135–145)
Total Bilirubin: 1.4 mg/dL — ABNORMAL HIGH (ref 0.3–1.2)
Total Protein: 6.8 g/dL (ref 6.5–8.1)

## 2018-10-17 LAB — ECHOCARDIOGRAM COMPLETE
Height: 69 in
Weight: 4000 oz

## 2018-10-17 LAB — LIPID PANEL
Cholesterol: 291 mg/dL — ABNORMAL HIGH (ref 0–200)
HDL: 30 mg/dL — ABNORMAL LOW (ref >40)
LDL Cholesterol: 230 mg/dL — ABNORMAL HIGH (ref 0–99)
Total CHOL/HDL Ratio: 9.7 RATIO
Triglycerides: 157 mg/dL — ABNORMAL HIGH (ref <150)
VLDL: 31 mg/dL (ref 0–40)

## 2018-10-17 LAB — HEMOGLOBIN A1C
Hgb A1c MFr Bld: 5.9 % — ABNORMAL HIGH (ref 4.8–5.6)
Mean Plasma Glucose: 122.63 mg/dL

## 2018-10-17 MED ORDER — AMLODIPINE-OLMESARTAN 10-40 MG PO TABS: 1.0000 | ORAL_TABLET | Freq: Every day | ORAL | Status: DC

## 2018-10-17 MED ORDER — AMLODIPINE BESYLATE 5 MG PO TABS
10.0000 mg | ORAL_TABLET | Freq: Every day | ORAL | Status: DC
  Administered 2018-10-17: 15:00:00 10 mg via ORAL
  Filled 2018-10-17: qty 2

## 2018-10-17 MED ORDER — CLONIDINE HCL 0.1 MG PO TABS
0.1000 mg | ORAL_TABLET | Freq: Two times a day (BID) | ORAL | Status: DC
  Administered 2018-10-17: 15:00:00 0.1 mg via ORAL
  Filled 2018-10-17 (×2): qty 1

## 2018-10-17 MED ORDER — DOXAZOSIN MESYLATE 2 MG PO TABS
2.0000 mg | ORAL_TABLET | Freq: Two times a day (BID) | ORAL | Status: DC
  Administered 2018-10-17: 15:00:00 2 mg via ORAL
  Filled 2018-10-17: qty 1

## 2018-10-17 MED ORDER — IRBESARTAN 150 MG PO TABS
300.0000 mg | ORAL_TABLET | Freq: Every day | ORAL | Status: DC
  Administered 2018-10-17: 15:00:00 300 mg via ORAL
  Filled 2018-10-17: qty 2

## 2018-10-17 MED ORDER — LOPERAMIDE HCL 2 MG PO CAPS
2.0000 mg | ORAL_CAPSULE | Freq: Once | ORAL | Status: AC
  Administered 2018-10-17: 02:00:00 2 mg via ORAL
  Filled 2018-10-17: qty 1

## 2018-10-17 MED ORDER — CARVEDILOL 12.5 MG PO TABS
25.0000 mg | ORAL_TABLET | Freq: Two times a day (BID) | ORAL | Status: DC
  Administered 2018-10-17: 17:00:00 25 mg via ORAL
  Filled 2018-10-17: qty 2

## 2018-10-17 NOTE — Procedures (Signed)
ELECTROENCEPHALOGRAM REPORT   Patient: Gabriel Zavala       Room #: A302 EEG No. ID: [id number] Age: 53 y.o.        Sex: male Referring Physician: Manuella Ghazi Report Date:  10/17/2018        Interpreting Physician: Alexis Goodell  History: Gabriel Zavala is an 53 y.o. male presenting after generalized seizure activity  Medications:  ASA, Zyloprim, Fenofibrate  Conditions of Recording:  This is a 21 channel routine scalp EEG performed with bipolar and monopolar montages arranged in accordance to the international 10/20 system of electrode placement. One channel was dedicated to EKG recording.  The patient is in the awake, drowsy and asleep states.  Description:  The waking background activity consists of a low voltage, symmetrical, fairly well organized, 10 Hz alpha activity, seen from the parieto-occipital and posterior temporal regions.  Low voltage fast activity, poorly organized, is seen anteriorly and is at times superimposed on more posterior regions.  A mixture of theta and alpha rhythms are seen from the central and temporal regions. The patient drowses with slowing to irregular, low voltage theta and beta activity.   The patient goes in to a light sleep with symmetrical sleep spindles, vertex central sharp transients and irregular slow activity.  No epileptiform activity is noted.   Hyperventilation and intermittent photic stimulation were not performed.  IMPRESSION: Normal electroencephalogram, awake and asleep. There are no focal lateralizing or epileptiform features.   Alexis Goodell, MD Neurology [phone] 10/17/2018, 12:12 PM

## 2018-10-17 NOTE — Progress Notes (Signed)
EEG complete - results pending 

## 2018-10-17 NOTE — Progress Notes (Addendum)
IV removed and discharge instructions reviewed.  Suggested checking blood pressure daily  and take readings to follow up appt.  No deficits from possible stroke.  Stoke teaching and booklet given to patient

## 2018-10-17 NOTE — Evaluation (Addendum)
Physical Therapy Evaluation Patient Details Name: Gabriel Zavala MRN: [id number] DOB: 07-02-1965 Today's Date: 10/17/2018   History of Present Illness  Gabriel Zavala is a 53 y.o. male with medical history significant of gout, hyperlipidemia, hypertension who is brought to the emergency department due to having generalized seizure at home.  He has no previous history of convulsions.  The patient denies having any aura symptoms.  The next thing he remembers after his seizure is getting off the ambulance at the hospital.  When he woke up, the patient had left-sided hemiparesis which subsequently resolved after he arrived to the ED.  He denies headache, blurred vision, slurred speech or gait abnormality.  He denies fever, chills, rhinorrhea, sore throat, dyspnea, wheezing, chest pain, palpitations, diaphoresis, dizziness, PND, orthopnea or pitting edema of the lower extremities.  No dysuria, frequency or hematuria.  Denies polyuria, polydipsia, polyphagia or blurred vision.    Clinical Impression  Patient functioning at baseline for functional mobility, gait and going up down stairs.  Plan:  Patient discharged from physical therapy to care of nursing for ambulation daily as tolerated for length of stay.     Follow Up Recommendations No PT follow up    Equipment Recommendations  None recommended by PT    Recommendations for Other Services       Precautions / Restrictions Precautions Precautions: None;Other (comment) Precaution Comments: Seizure Restrictions Weight Bearing Restrictions: No      Mobility  Bed Mobility Overal bed mobility: Independent                Transfers Overall transfer level: Independent                  Ambulation/Gait Ambulation/Gait assistance: Independent Gait Distance (Feet): 200 Feet   Gait Pattern/deviations: WFL(Within Functional Limits) Gait velocity: normal   General Gait Details: demonstrates good return for  ambulation on level, inclined and declined surfaces  Stairs            Wheelchair Mobility    Modified Rankin (Stroke Patients Only)       Balance Overall balance assessment: Independent                                           Pertinent Vitals/Pain Pain Assessment: No/denies pain    Home Living Family/patient expects to be discharged to:: Private residence Living Arrangements: Alone Available Help at Discharge: Family Type of Home: (Motel) Home Access: Stairs to enter   Technical brewer of Steps: 1 flight Home Layout: Two level Home Equipment: None      Prior Function Level of Independence: Independent         Comments: Hydrographic surveyor, 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, newspaper        Extremity/Trunk Assessment   Upper Extremity Assessment Upper Extremity Assessment: Defer to OT evaluation    Lower Extremity Assessment Lower Extremity Assessment: Overall WFL for tasks assessed    Cervical / Trunk Assessment Cervical / Trunk Assessment: Normal  Communication   Communication: No difficulties  Cognition Arousal/Alertness: Awake/alert Behavior During Therapy: WFL for tasks assessed/performed Overall Cognitive Status: Within Functional Limits for tasks assessed  General Comments      Exercises     Assessment/Plan    PT Assessment Patent does not need any further PT services  PT Problem List         PT Treatment Interventions      PT Goals (Current goals can be found in the Care Plan section)  Acute Rehab PT Goals Patient Stated Goal: return home PT Goal Formulation: With patient Time For Goal Achievement: 10/17/18 Potential to Achieve Goals: Good    Frequency     Barriers to discharge        Co-evaluation               AM-PAC PT "6 Clicks" Mobility  Outcome Measure Help needed turning from your back to your side while in a flat bed  without using bedrails?: None Help needed moving from lying on your back to sitting on the side of a flat bed without using bedrails?: None Help needed moving to and from a bed to a chair (including a wheelchair)?: None Help needed standing up from a chair using your arms (e.g., wheelchair or bedside chair)?: None Help needed to walk in hospital room?: None Help needed climbing 3-5 steps with a railing? : None 6 Click Score: 24    End of Session   Activity Tolerance: Patient tolerated treatment well Patient left: in bed;with call bell/phone within reach;Other (comment)(seated at bedside to eat breakfast) Nurse Communication: Mobility status PT Visit Diagnosis: Unsteadiness on feet (R26.81);Other abnormalities of gait and mobility (R26.89);Muscle weakness (generalized) (M62.81)    Time: [id number] PT Time Calculation (min) (ACUTE ONLY): 21 min   Charges:   PT Evaluation $PT Eval Moderate Complexity: 1 Mod PT Treatments $Gait Training: 8-22 mins        8:44 AM, 10/17/18 Ocie BobJames Emonni Depasquale, MPT Physical Therapist with [facility] [phone] office [phone] mobile phone

## 2018-10-17 NOTE — Progress Notes (Signed)
2D Echocardiogram has been performed.  Gabriel Zavala 10/17/2018, 2:53 PM

## 2018-10-17 NOTE — Progress Notes (Signed)
OT Cancellation Note  Patient Details Name: Brownie Nehme MRN: [id number] DOB: [birth year]   Cancelled Treatment:    Reason Eval/Treat Not Completed: OT screened, no needs identified, will sign off. Pt screened in room, reports feeling improved. Pt without facial droop, BUE strength 5/5, coordination and sensation are intact. Pt with 5/10 mid-back pain after falling yesterday with seizure. Pt is independent and at his baseline with ADL completion, no further OT services required at this time.    Guadelupe Sabin, OTR/L  [phone] 10/17/2018, 7:28 AM

## 2018-10-17 NOTE — Discharge Summary (Signed)
Physician Discharge Summary  [name] GNF:[id number] DOB: 05-Mar-1966 DOA: 10/16/2018  PCP: Ignatius SpeckingVyas, Dhruv B, MD  Admit date: 10/16/2018  Discharge date: 10/17/2018  Admitted From:Home  Disposition:  Home  Recommendations for Outpatient Follow-up:  1. Follow up with PCP in 1-2 weeks 2. Follow-up with Dr. Gerilyn Pilgrimoonquah in 4 weeks as recommended 3. No antiepileptics currently recommended 4. Continue on home blood pressure agents as well as aspirin as previously prescribed and monitor closely  Home Health: None  Equipment/Devices: None  Discharge Condition: Stable  CODE STATUS: Full  Diet recommendation: Heart Healthy  Brief/Interim Summary: Per HPI: Gabriel Zavala is a 53 y.o. male with medical history significant of gout, hyperlipidemia, hypertension who is brought to the emergency department due to having generalized seizure at home.  He has no previous history of convulsions.  The patient denies having any aura symptoms.  The next thing he remembers after his seizure is getting off the ambulance at the hospital.  When he woke up, the patient had left-sided hemiparesis which subsequently resolved after he arrived to the ED.  He denies headache, blurred vision, slurred speech or gait abnormality.  He denies fever, chills, rhinorrhea, sore throat, dyspnea, wheezing, chest pain, palpitations, diaphoresis, dizziness, PND, orthopnea or pitting edema of the lower extremities.  No dysuria, frequency or hematuria.  Denies polyuria, polydipsia, polyphagia or blurred vision.   Patient was admitted with new onset seizure with possible TIA.  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  He was also noted to be hypokalemic.  He is doing much better this morning and EEG does not demonstrate any seizure activity.  Discussed case with Dr. Gerilyn Pilgrimoonquah of neurology who does not feel that antiepileptic drugs are currently needed and that patient  should continue to take his home blood pressure medications and aspirin as prescribed.  He has not been given most of these agents throughout the day and will be given these medications as his blood pressures have elevated and will be rechecked prior to discharge.  He is otherwise in stable condition denies any symptomatic complaints or concerns.  No other acute events noted throughout the course of this admission.  Discharge Diagnoses:  Principal Problem:   Seizure (HCC) Active Problems:   AKI (acute kidney injury) (HCC)   Hypokalemia   Elevated bilirubin   Leukocytosis   TIA (transient ischemic attack)   Hyperglycemia  Principal discharge diagnosis: Seizure.  Discharge Instructions  Discharge Instructions    Diet - low sodium heart healthy   Complete by: As directed    Increase activity slowly   Complete by: As directed      Allergies as of 10/17/2018   No Known Allergies     Medication List    TAKE these medications   allopurinol 300 MG tablet Commonly known as: ZYLOPRIM Take 300 mg by mouth daily.   amLODipine-olmesartan 10-40 MG tablet Commonly known as: AZOR Take 1 tablet by mouth daily.   aspirin EC 81 MG tablet Take 81 mg by mouth daily.   carvedilol 25 MG tablet Commonly known as: COREG Take 25 mg by mouth 2 (two) times daily with a meal.   cloNIDine 0.1 MG tablet Commonly known as: CATAPRES Take 0.1 mg by mouth 2 (two) times daily.   doxazosin 2 MG tablet Commonly known as: CARDURA Take 2 mg by mouth 2 (two) times daily.   fenofibrate 145 MG tablet Commonly known as: TRICOR Take 145  mg by mouth once a week.   multivitamin with minerals Tabs tablet Take 1 tablet by mouth daily.   oxymetazoline 0.05 % nasal spray Commonly known as: AFRIN Place 1 spray into both nostrils 2 (two) times daily as needed for congestion.   tetrahydrozoline-zinc 0.05-0.25 % ophthalmic solution Commonly known as: VISINE-AC Place 2 drops into both eyes 3 (three) times  daily as needed (for dry eye).      Follow-up Information    Vyas, Dhruv B, MD Follow up in 1 week(s).   Specialty: Internal Medicine Contact information: [address] [place] [address] [phone]        Beryle Beamsoonquah, Kofi, MD. Schedule an appointment as soon as possible for a visit in 4 week(s).   Specialty: Neurology Contact information: [address] [place] [address] [phone]          No Known Allergies  Consultations:  Spoke with Dr. Gerilyn Pilgrimoonquah with neurology on phone   Procedures/Studies: Dg Chest 2 View  Result Date: 10/16/2018 CLINICAL DATA:  Seizure EXAM: CHEST - 2 VIEW COMPARISON:  February 20, 2008 FINDINGS: The heart size and mediastinal contours are within normal limits. Both lungs are clear. The visualized skeletal structures are unremarkable. IMPRESSION: No active cardiopulmonary disease. Electronically Signed   By: Katherine Mantlehristopher  Green M.D.   On: 10/16/2018 18:52   Mr Brain Wo Contrast  Result Date: 10/16/2018 CLINICAL DATA:  Initial evaluation for acute slurred speech, left-sided facial droop, now resolved. EXAM: MRI HEAD WITHOUT CONTRAST TECHNIQUE: Multiplanar, multiecho pulse sequences of the brain and surrounding structures were obtained without intravenous contrast. COMPARISON:  Prior CT from earlier the same day. FINDINGS: Brain: Generalized age-related cerebral atrophy. Patchy and confluent T2/FLAIR hyperintensity within the periventricular and deep white matter both cerebral hemispheres most consistent with chronic small vessel ischemic disease, fairly advanced in nature. Chronic microvascular ischemic changes present within the pons. Multiple superimposed remote lacunar infarcts present within the bilateral basal ganglia and thalami. No abnormal foci of restricted diffusion to suggest acute or subacute ischemia. Gray-white matter differentiation maintained. No encephalomalacia to suggest chronic cortical infarction. No acute intracranial  hemorrhage. Few scattered chronic micro hemorrhages noted within the left thalamus and left pons, most likely related to chronic underlying hypertension. No mass lesion, midline shift or mass effect. No hydrocephalus. No extra-axial fluid collection. Pituitary gland suprasellar region within normal limits. Midline structures intact and normal. Vascular: Major intracranial vascular flow voids are maintained. Left vertebral artery hypoplastic and not well seen, likely terminating in PICA. Skull and upper cervical spine: Craniocervical junction within normal limits. Upper cervical spine normal. Bone marrow signal intensity within normal limits. No scalp soft tissue abnormality. Sinuses/Orbits: Globes and orbital soft tissues demonstrate no acute finding. Paranasal sinuses are largely clear. No mastoid effusion. Inner ear structures grossly normal. Other: None. IMPRESSION: 1. No acute intracranial infarct or other abnormality identified. 2. Age-related cerebral atrophy with advanced chronic microvascular ischemic disease, with multiple remote lacunar infarcts involving the bilateral basal ganglia and thalami. Electronically Signed   By: Rise MuBenjamin  McClintock M.D.   On: 10/16/2018 19:13   [place] Carotid Bilateral (at [facility] And [facility] Only)  Result Date: 10/17/2018 CLINICAL DATA:  TIA.  History of hyperlipidemia. EXAM: BILATERAL CAROTID DUPLEX ULTRASOUND TECHNIQUE: Wallace CullensGray scale imaging, color Doppler and duplex ultrasound were performed of bilateral carotid and vertebral arteries in the neck. COMPARISON:  None. FINDINGS: Criteria: Quantification of carotid stenosis is based on velocity parameters that correlate the residual internal carotid diameter with NASCET-based stenosis  levels, using the diameter of the distal internal carotid lumen as the denominator for stenosis measurement. The following velocity measurements were obtained: RIGHT ICA: 37/7 cm/sec CCA: 61/7 cm/sec SYSTOLIC ICA/CCA RATIO:  0.6 ECA: 92 cm/sec LEFT ICA:  279/27 cm/sec CCA: 52/11 cm/sec SYSTOLIC ICA/CCA RATIO:  1.5 ECA: 88 cm/sec RIGHT CAROTID ARTERY: There is a minimal amount of eccentric echogenic plaque within the right carotid bulb (image 16), extending to involve the origin and proximal aspects of the right internal carotid artery (image 26), not resulting in elevated peak systolic velocities within the interrogated course the right internal carotid artery to suggest a hemodynamically significant stenosis. RIGHT VERTEBRAL ARTERY:  Antegrade flow LEFT CAROTID ARTERY: There is a minimal amount of intimal thickening/hypoechoic plaque involving the mid (image 43) and distal (image 47) aspects of the left common carotid artery. There is a minimal amount of eccentric mixed echogenic plaque involving the left carotid bulb (image 52), extending to involve the origin and proximal aspects of the left internal carotid artery (image 60), not resulting in elevated peak systolic velocities within the interrogated course the left internal carotid artery to suggest a hemodynamically significant stenosis. LEFT VERTEBRAL ARTERY:  Antegrade Flow IMPRESSION: Minimal amount of bilateral atherosclerotic plaque, not resulting in a hemodynamically significant stenosis within either internal carotid artery. Electronically Signed   By: Simonne Come M.D.   On: 10/17/2018 07:42   Ct Head Code Stroke Wo Contrast  Result Date: 10/16/2018 CLINICAL DATA:  Code stroke. Focal neuro deficit less than 6 hours. Left facial numbness. EXAM: CT HEAD WITHOUT CONTRAST TECHNIQUE: Contiguous axial images were obtained from the base of the skull through the vertex without intravenous contrast. COMPARISON:  None. FINDINGS: Brain: Ventricle size normal. Mild atrophy. Extensive white matter disease bilaterally most likely chronic. 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 Small infarcts in the thalamus bilaterally likely chronic. Negative for acute infarct, hemorrhage, mass. Vascular: Negative for  hyperdense vessel Skull: Negative Sinuses/Orbits: Negative Other: None ASPECTS ([place] Stroke Program Early CT Score) - Ganglionic level infarction (caudate, lentiform nuclei, internal capsule, insula, M1-M3 cortex): 7 - Supraganglionic infarction (M4-M6 cortex): 3 Total score (0-10 with 10 being normal): 10 IMPRESSION: 1. No acute abnormality 2. Atrophy and extensive chronic microvascular ischemia for age 30. ASPECTS is 10 4. These results were called by telephone at the time of interpretation on 10/16/2018 at 3:18 pm to Dr. Bethann Berkshire , who verbally acknowledged these results. Electronically Signed   By: Marlan Palau M.D.   On: 10/16/2018 15:18     Discharge Exam: Vitals:   10/17/18 1340 10/17/18 1342  BP: (!) 175/94 (!) 188/108  Pulse: 80 79  Resp: 16   Temp: 99.1 F (37.3 C)   SpO2: 99%    Vitals:   10/17/18 0745 10/17/18 0900 10/17/18 1340 10/17/18 1342  BP: 138/90  (!) 175/94 (!) 188/108  Pulse: 71  80 79  Resp:   16   Temp:   99.1 F (37.3 C)   TempSrc:   Oral   SpO2:  98% 99%   Weight:      Height:        General: Pt is alert, awake, not in acute distress Cardiovascular: RRR, S1/S2 +, no rubs, no gallops Respiratory: CTA bilaterally, no wheezing, no rhonchi Abdominal: Soft, NT, ND, bowel sounds + Extremities: no edema, no cyanosis    The results of significant diagnostics from this hospitalization (including imaging, microbiology, ancillary and laboratory) are listed below for reference.  Microbiology: Recent Results (from the past 240 hour(s))  SARS Coronavirus 2 (CEPHEID - Performed in [facility] lab), Hosp Order     Status: None   Collection Time: 10/16/18  3:56 PM   Specimen: Nasopharyngeal Swab  Result Value Ref Range Status   SARS Coronavirus 2 NEGATIVE NEGATIVE Final    Comment: (NOTE) If result is NEGATIVE SARS-CoV-2 target nucleic acids are NOT DETECTED. The SARS-CoV-2 RNA is generally detectable in upper and lower  respiratory  specimens during the acute phase of infection. The lowest  concentration of SARS-CoV-2 viral copies this assay can detect is 250  copies / mL. A negative result does not preclude SARS-CoV-2 infection  and should not be used as the sole basis for treatment or other  patient management decisions.  A negative result may occur with  improper specimen collection / handling, submission of specimen other  than nasopharyngeal swab, presence of viral mutation(s) within the  areas targeted by this assay, and inadequate number of viral copies  (<250 copies / mL). A negative result must be combined with clinical  observations, patient history, and epidemiological information. If result is POSITIVE SARS-CoV-2 target nucleic acids are DETECTED. The SARS-CoV-2 RNA is generally detectable in upper and lower  respiratory specimens dur ing the acute phase of infection.  Positive  results are indicative of active infection with SARS-CoV-2.  Clinical  correlation with patient history and other diagnostic information is  necessary to determine patient infection status.  Positive results do  not rule out bacterial infection or co-infection with other viruses. If result is PRESUMPTIVE POSTIVE SARS-CoV-2 nucleic acids MAY BE PRESENT.   A presumptive positive result was obtained on the submitted specimen  and confirmed on repeat testing.  While 2019 novel coronavirus  (SARS-CoV-2) nucleic acids may be present in the submitted sample  additional confirmatory testing may be necessary for epidemiological  and / or clinical management purposes  to differentiate between  SARS-CoV-2 and other Sarbecovirus currently known to infect humans.  If clinically indicated additional testing with an alternate test  methodology [phone]([id number]) is advised. The SARS-CoV-2 RNA is generally  detectable in upper and lower respiratory sp ecimens during the acute  phase of infection. The expected result is Negative. Fact Sheet for  Patients:  BoilerBrush.com.cyhttps://www.fda.gov/media/136312/download Fact Sheet for Healthcare Providers: https://pope.com/https://www.fda.gov/media/136313/download This test is not yet approved or cleared by the [place] FDA and has been authorized for detection and/or diagnosis of SARS-CoV-2 by FDA under an Emergency Use Authorization (EUA).  This EUA will remain in effect (meaning this test can be used) for the duration of the COVID-19 declaration under Section 564(b)(1) of the Act, 21 U.S.C. section 360bbb-3(b)(1), unless the authorization is terminated or revoked sooner. Performed at [facility], [address] [place], [place] [address]      Labs: BNP (last 3 results) No results for input(s): BNP in the last 8760 hours. Basic Metabolic Panel: Recent Labs  Lab 10/16/18 1518 10/16/18 1845 10/17/18 0425  NA 137 135 137  K 2.8* 3.0* 3.5  CL 96* 96* 102  CO2 15* 21* 24  GLUCOSE 156* 111* 128*  BUN 10 10 11   CREATININE 1.98* 1.45* 1.49*  CALCIUM 9.0 8.8* 8.7*  MG 1.9  --   --    Liver Function Tests: Recent Labs  Lab 10/16/18 1518 10/17/18 0425  AST 99* 65*  ALT <5 42  ALKPHOS 78 60  BILITOT 1.5* 1.4*  PROT 7.8 6.8  ALBUMIN 4.1 3.5   No  results for input(s): LIPASE, AMYLASE in the last 168 hours. No results for input(s): AMMONIA in the last 168 hours. CBC: Recent Labs  Lab 10/16/18 1518 10/17/18 0425  WBC 13.2* 6.8  NEUTROABS 5.9  --   HGB 14.7 13.2  HCT 46.8 41.3  MCV 92.5 91.4  PLT 240 193   Cardiac Enzymes: No results for input(s): CKTOTAL, CKMB, CKMBINDEX, TROPONINI in the last 168 hours. BNP: Invalid input(s): POCBNP CBG: Recent Labs  Lab 10/16/18 1516  GLUCAP 156*   D-Dimer No results for input(s): DDIMER in the last 72 hours. Hgb A1c Recent Labs    10/16/18 1518  HGBA1C 5.9*   Lipid Profile Recent Labs    10/17/18 0425  CHOL 291*  HDL 30*  LDLCALC 230*  TRIG 157*  CHOLHDL 9.7   Thyroid function studies No results for input(s): TSH, T4TOTAL, T3FREE,  THYROIDAB in the last 72 hours.  Invalid input(s): FREET3 Anemia work up No results for input(s): VITAMINB12, FOLATE, FERRITIN, TIBC, IRON, RETICCTPCT in the last 72 hours. Urinalysis    Component Value Date/Time   COLORURINE AMBER (A) 10/16/2018 1550   APPEARANCEUR CLOUDY (A) 10/16/2018 1550   LABSPEC 1.031 (H) 10/16/2018 1550   PHURINE 5.0 10/16/2018 1550   GLUCOSEU NEGATIVE 10/16/2018 1550   HGBUR NEGATIVE 10/16/2018 1550   BILIRUBINUR MODERATE (A) 10/16/2018 1550   KETONESUR NEGATIVE 10/16/2018 1550   PROTEINUR >=300 (A) 10/16/2018 1550   NITRITE NEGATIVE 10/16/2018 1550   LEUKOCYTESUR NEGATIVE 10/16/2018 1550   Sepsis Labs Invalid input(s): PROCALCITONIN,  WBC,  LACTICIDVEN Microbiology Recent Results (from the past 240 hour(s))  SARS Coronavirus 2 (CEPHEID - Performed in [facility] hospital lab), Hosp Order     Status: None   Collection Time: 10/16/18  3:56 PM   Specimen: Nasopharyngeal Swab  Result Value Ref Range Status   SARS Coronavirus 2 NEGATIVE NEGATIVE Final    Comment: (NOTE) If result is NEGATIVE SARS-CoV-2 target nucleic acids are NOT DETECTED. The SARS-CoV-2 RNA is generally detectable in upper and lower  respiratory specimens during the acute phase of infection. The lowest  concentration of SARS-CoV-2 viral copies this assay can detect is 250  copies / mL. A negative result does not preclude SARS-CoV-2 infection  and should not be used as the sole basis for treatment or other  patient management decisions.  A negative result may occur with  improper specimen collection / handling, submission of specimen other  than nasopharyngeal swab, presence of viral mutation(s) within the  areas targeted by this assay, and inadequate number of viral copies  (<250 copies / mL). A negative result must be combined with clinical  observations, patient history, and epidemiological information. If result is POSITIVE SARS-CoV-2 target nucleic acids are DETECTED. The  SARS-CoV-2 RNA is generally detectable in upper and lower  respiratory specimens dur ing the acute phase of infection.  Positive  results are indicative of active infection with SARS-CoV-2.  Clinical  correlation with patient history and other diagnostic information is  necessary to determine patient infection status.  Positive results do  not rule out bacterial infection or co-infection with other viruses. If result is PRESUMPTIVE POSTIVE SARS-CoV-2 nucleic acids MAY BE PRESENT.   A presumptive positive result was obtained on the submitted specimen  and confirmed on repeat testing.  While 2019 novel coronavirus  (SARS-CoV-2) nucleic acids may be present in the submitted sample  additional confirmatory testing may be necessary for epidemiological  and / or clinical management purposes  to differentiate  between  SARS-CoV-2 and other Sarbecovirus currently known to infect humans.  If clinically indicated additional testing with an alternate test  methodology [phone]) is advised. The SARS-CoV-2 RNA is generally  detectable in upper and lower respiratory sp ecimens during the acute  phase of infection. The expected result is Negative. Fact Sheet for Patients:  BoilerBrush.com.cy Fact Sheet for Healthcare Providers: https://pope.com/ This test is not yet approved or cleared by the [place] FDA and has been authorized for detection and/or diagnosis of SARS-CoV-2 by FDA under an Emergency Use Authorization (EUA).  This EUA will remain in effect (meaning this test can be used) for the duration of the COVID-19 declaration under Section 564(b)(1) of the Act, 21 U.S.C. section 360bbb-3(b)(1), unless the authorization is terminated or revoked sooner. Performed at [facility], [address], [place], [place] [address]      Time coordinating discharge: 35 minutes  SIGNED:   Erick Blinks, DO [facility] 10/17/2018, 2:00 PM  If  7PM-7AM, please contact night-coverage [email or web address] Password [email or web address]

## 2019-01-28 ENCOUNTER — Encounter (HOSPITAL_COMMUNITY): Payer: Self-pay | Admitting: Emergency Medicine

## 2019-01-28 ENCOUNTER — Other Ambulatory Visit: Payer: Self-pay

## 2019-01-28 ENCOUNTER — Emergency Department (HOSPITAL_COMMUNITY)
Admission: EM | Admit: 2019-01-28 | Discharge: 2019-01-28 | Disposition: A | Discharge status: Home / Self Care | Payer: BC Managed Care – PPO | Attending: Emergency Medicine | Admitting: Emergency Medicine

## 2019-01-28 DIAGNOSIS — R7989 Other specified abnormal findings of blood chemistry: Secondary | ICD-10-CM | POA: Diagnosis not present

## 2019-01-28 DIAGNOSIS — R531 Weakness: Secondary | ICD-10-CM | POA: Diagnosis present

## 2019-01-28 DIAGNOSIS — I10 Essential (primary) hypertension: Secondary | ICD-10-CM | POA: Diagnosis not present

## 2019-01-28 DIAGNOSIS — R61 Generalized hyperhidrosis: Secondary | ICD-10-CM | POA: Insufficient documentation

## 2019-01-28 DIAGNOSIS — Z79899 Other long term (current) drug therapy: Secondary | ICD-10-CM | POA: Insufficient documentation

## 2019-01-28 DIAGNOSIS — Z7982 Long term (current) use of aspirin: Secondary | ICD-10-CM | POA: Diagnosis not present

## 2019-01-28 DIAGNOSIS — R42 Dizziness and giddiness: Secondary | ICD-10-CM | POA: Insufficient documentation

## 2019-01-28 LAB — URINALYSIS, ROUTINE W REFLEX MICROSCOPIC
Bacteria, UA: NONE SEEN
Glucose, UA: NEGATIVE mg/dL
Hgb urine dipstick: NEGATIVE
Ketones, ur: 5 mg/dL — AB
Leukocytes,Ua: NEGATIVE
Nitrite: NEGATIVE
Protein, ur: 100 mg/dL — AB
Specific Gravity, Urine: 1.027 (ref 1.005–1.030)
pH: 5 (ref 5.0–8.0)

## 2019-01-28 LAB — BASIC METABOLIC PANEL
Anion gap: 15 (ref 5–15)
BUN: 10 mg/dL (ref 6–20)
CO2: 20 mmol/L — ABNORMAL LOW (ref 22–32)
Calcium: 8.6 mg/dL — ABNORMAL LOW (ref 8.9–10.3)
Chloride: 100 mmol/L (ref 98–111)
Creatinine, Ser: 1.99 mg/dL — ABNORMAL HIGH (ref 0.61–1.24)
GFR calc Af Amer: 43 mL/min — ABNORMAL LOW (ref >60)
GFR calc non Af Amer: 37 mL/min — ABNORMAL LOW (ref >60)
Glucose, Bld: 181 mg/dL — ABNORMAL HIGH (ref 70–99)
Potassium: 3.5 mmol/L (ref 3.5–5.1)
Sodium: 135 mmol/L (ref 135–145)

## 2019-01-28 LAB — CBC
HCT: 46.9 % (ref 39.0–52.0)
Hemoglobin: 15.2 g/dL (ref 13.0–17.0)
MCH: 28.8 pg (ref 26.0–34.0)
MCHC: 32.4 g/dL (ref 30.0–36.0)
MCV: 88.8 fL (ref 80.0–100.0)
Platelets: 292 10*3/uL (ref 150–400)
RBC: 5.28 MIL/uL (ref 4.22–5.81)
RDW: 15.1 % (ref 11.5–15.5)
WBC: 9.5 10*3/uL (ref 4.0–10.5)
nRBC: 0.4 % — ABNORMAL HIGH (ref 0.0–0.2)

## 2019-01-28 LAB — CBG MONITORING, ED: Glucose-Capillary: 183 mg/dL — ABNORMAL HIGH (ref 70–99)

## 2019-01-28 MED ORDER — SODIUM CHLORIDE 0.9 % IV BOLUS
1000.0000 mL | Freq: Once | INTRAVENOUS | Status: AC
  Administered 2019-01-28: 1000 mL via INTRAVENOUS

## 2019-01-28 MED ORDER — SODIUM CHLORIDE 0.9% FLUSH
3.0000 mL | Freq: Once | INTRAVENOUS | Status: AC
  Administered 2019-01-28: 3 mL via INTRAVENOUS

## 2019-01-28 MED ORDER — SODIUM CHLORIDE 0.9 % IV BOLUS
1000.0000 mL | Freq: Once | INTRAVENOUS | Status: AC
  Administered 2019-01-28: 19:00:00 1000 mL via INTRAVENOUS

## 2019-01-28 NOTE — ED Provider Notes (Addendum)
[facility] EMERGENCY DEPARTMENT Provider Note   CSN: [id number] Arrival date & time: 01/28/19  1731     History   Chief Complaint Chief Complaint  Patient presents with  . Dizziness  . Near Syncope    HPI Gabriel Zavala is a 53 y.o. male.     Weakness, diaphoresis, dizziness earlier today, improving in the ED.  Similar episode 2 weeks ago resulting in an emergency department visit.  Patient admitted to the hospital on 10/16/2018 with a seizure and possible TIA.  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  No prodromal illnesses recently.  Physically no cough, chest pain, fever, sweats, chills, dysuria, or gross neuro deficits.  Severity is moderate.  Nothing makes symptoms better or worse.     Past Medical History:  Diagnosis Date  . Gout   . History of nuclear stress test 06/2017   "No evidence of pharmacologically induced myocardial ischemia or prior MI"  . Hypercholesteremia   . Hypertension   . Seizure (HCC) 10/16/2018    Patient Active Problem List   Diagnosis Date Noted  . Seizure (HCC) 10/16/2018  . AKI (acute kidney injury) (HCC) 10/16/2018  . Hypokalemia 10/16/2018  . Elevated bilirubin 10/16/2018  . Leukocytosis 10/16/2018  . TIA (transient ischemic attack) 10/16/2018  . Hyperglycemia 10/16/2018    Past Surgical History:  Procedure Laterality Date  . HERNIA REPAIR          Home Medications    Prior to Admission medications   Medication Sig Start Date End Date Taking? Authorizing Provider  allopurinol (ZYLOPRIM) 300 MG tablet Take 300 mg by mouth daily.    Yes [provider]  amLODipine-olmesartan (AZOR) 10-40 MG tablet Take 1 tablet by mouth daily.    Yes [provider]  aspirin EC 81 MG tablet Take 81 mg by mouth daily.   Yes [provider]  carvedilol (COREG) 25 MG tablet Take 25 mg by mouth 2 (two) times daily with a meal.    Yes [provider]  cloNIDine (CATAPRES)  0.1 MG tablet Take 0.1 mg by mouth 2 (two) times daily.   Yes [provider]  doxazosin (CARDURA) 2 MG tablet Take 2 mg by mouth 2 (two) times daily.    Yes [provider]  fenofibrate (TRICOR) 145 MG tablet Take 145 mg by mouth once a week.    Yes [provider]  Multiple Vitamin (MULTIVITAMIN WITH MINERALS) TABS tablet Take 1 tablet by mouth daily.   Yes [provider]  oxymetazoline (AFRIN) 0.05 % nasal spray Place 1 spray into both nostrils 2 (two) times daily as needed for congestion.   Yes [provider]  tetrahydrozoline-zinc (VISINE-AC) 0.05-0.25 % ophthalmic solution Place 2 drops into both eyes 3 (three) times daily as needed (for dry eye).   Yes [provider]  traZODone (DESYREL) 100 MG tablet Take 100 mg by mouth at bedtime. 12/20/18  Yes [provider]    Family History Family History  Problem Relation Age of Onset  . Hypertension Mother   . Hypertension Sister   . Hypertension Brother   . Seizures Brother        Secondary to TBI  . Hypertension Sister     Social History Social History   Tobacco Use  . Smoking status: Never Smoker  . Smokeless tobacco: Never Used  Substance Use Topics  . Alcohol use: Yes    Comment: occ  . Drug use: Not  Currently     Allergies   Patient has no known allergies.   Review of Systems Review of Systems  All other systems reviewed and are negative.    Physical Exam Updated Vital Signs BP 128/82   Pulse 92   Temp 98.8 F (37.1 C) (Oral)   Resp 20   SpO2 100%   Physical Exam Vitals signs and nursing note reviewed.  Constitutional:      Appearance: He is well-developed.  HENT:     Head: Normocephalic and atraumatic.  Eyes:     Conjunctiva/sclera: Conjunctivae normal.  Neck:     Musculoskeletal: Neck supple.  Cardiovascular:     Rate and Rhythm: Normal rate and regular rhythm.  Pulmonary:     Effort: Pulmonary effort is normal.     Breath sounds:  Normal breath sounds.  Abdominal:     General: Bowel sounds are normal.     Palpations: Abdomen is soft.  Musculoskeletal: Normal range of motion.  Skin:    General: Skin is warm and dry.  Neurological:     General: No focal deficit present.     Mental Status: He is alert and oriented to person, place, and time.  Psychiatric:        Behavior: Behavior normal.      ED Treatments / Results  Labs (all labs ordered are listed, but only abnormal results are displayed) Labs Reviewed  BASIC METABOLIC PANEL - Abnormal; Notable for the following components:      Result Value   CO2 20 (*)    Glucose, Bld 181 (*)    Creatinine, Ser 1.99 (*)    Calcium 8.6 (*)    GFR calc non Af Amer 37 (*)    GFR calc Af Amer 43 (*)    All other components within normal limits  CBC - Abnormal; Notable for the following components:   nRBC 0.4 (*)    All other components within normal limits  URINALYSIS, ROUTINE W REFLEX MICROSCOPIC - Abnormal; Notable for the following components:   Color, Urine AMBER (*)    APPearance CLOUDY (*)    Bilirubin Urine SMALL (*)    Ketones, ur 5 (*)    Protein, ur 100 (*)    All other components within normal limits  CBG MONITORING, ED - Abnormal; Notable for the following components:   Glucose-Capillary 183 (*)    All other components within normal limits    EKG EKG Interpretation  Date/Time:  Tuesday January 28 2019 18:07:54 EDT Ventricular Rate:  100 PR Interval:    QRS Duration: 83 QT Interval:  385 QTC Calculation: 497 R Axis:   -39 Text Interpretation:  Sinus tachycardia Consider left atrial enlargement Inferior infarct, old Anterior infarct, old Confirmed by Donnetta Hutchingook, Ayako Tapanes ([id number]) on 01/28/2019 7:47:46 PM   Radiology No results found.  Procedures Procedures (including critical care time)  Medications Ordered in ED Medications  sodium chloride flush (NS) 0.9 % injection 3 mL (3 mLs Intravenous Given 01/28/19 1813)  sodium chloride 0.9 % bolus  1,000 mL (0 mLs Intravenous Stopped 01/28/19 2000)  sodium chloride 0.9 % bolus 1,000 mL (1,000 mLs Intravenous New Bag/Given 01/28/19 2142)     Initial Impression / Assessment and Plan / ED Course  I have reviewed the triage vital signs and the nursing notes.  Pertinent labs & imaging results that were available during my care of the patient were reviewed by me and considered in my medical decision making (see chart for details).  Old chart reviewed from admission in July.  Will hydrate, check basic labs.  Normal neuro exam in the ED.  2220: Recheck.  Feeling better.  Discussed elevated blood pressure and elevated creatinine.  He will get primary care follow-up. Final Clinical Impressions(s) / ED Diagnoses   Final diagnoses:  Weakness  Diaphoresis  Dizziness  Hypertension, unspecified type  Elevated serum creatinine    ED Discharge Orders    None       Nat Christen, MD 01/28/19 1946    Nat Christen, MD 01/28/19 2226

## 2019-01-28 NOTE — ED Notes (Signed)
Pt was informed we need urine sample, urinal at bedside. 

## 2019-01-28 NOTE — ED Triage Notes (Signed)
Patient presents as diaphoretic and dizzy. Patient states nausea; denies vomiting.  While at registration patient had brief syncopal episode.

## 2019-01-28 NOTE — Discharge Instructions (Signed)
Your blood pressure and kidney function (called creatinine) were elevated.  These will need follow-up.  Increase fluid.  Off work Tuesday and Wednesday.

## 2019-02-25 ENCOUNTER — Encounter: Payer: Self-pay | Admitting: Gastroenterology

## 2019-03-06 NOTE — Progress Notes (Deleted)
Referring Provider: Ignatius Specking, MD Primary Care Physician:  Ignatius Specking, MD Primary Gastroenterologist:  Dr. Darrick Penna  No chief complaint on file.   HPI:   Gabriel Zavala is a 53 y.o. male presenting today at the request of Vyas, Dhruv B, MD for diarrhea.   Patient was admitted from 7/1-10/17/18 for new onset seizure with possible TIA. 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 EEG does not demonstrate any seizure activity. Case was discussed with Dr. Gerilyn Pilgrim of neurology who does not feel that antiepileptic drugs are currently needed and that patient should continue to take his home blood pressure medications and aspirin as prescribed.   Seen again in the ED on 01/28/19 for weakness, dizziness, and diaphoresis. Laboratory evaluation with worsenening kidney function with Cr 1.99 and elevated glucose at 189. No other significant findings. Patient improved with hydration. Was advised to follow-up with PCP.   Today he states   Past Medical History:  Diagnosis Date   Gout    History of nuclear stress test 06/2017   "No evidence of pharmacologically induced myocardial ischemia or prior MI"   Hypercholesteremia    Hypertension    Seizure (HCC) 10/16/2018    Past Surgical History:  Procedure Laterality Date   HERNIA REPAIR      Current Outpatient Medications  Medication Sig Dispense Refill   allopurinol (ZYLOPRIM) 300 MG tablet Take 300 mg by mouth daily.      amLODipine-olmesartan (AZOR) 10-40 MG tablet Take 1 tablet by mouth daily.      aspirin EC 81 MG tablet Take 81 mg by mouth daily.     carvedilol (COREG) 25 MG tablet Take 25 mg by mouth 2 (two) times daily with a meal.      cloNIDine (CATAPRES) 0.1 MG tablet Take 0.1 mg by mouth 2 (two) times daily.     doxazosin (CARDURA) 2 MG tablet Take 2 mg by mouth 2 (two) times daily.      fenofibrate (TRICOR) 145 MG tablet Take 145 mg by mouth once a week.       Multiple Vitamin (MULTIVITAMIN WITH MINERALS) TABS tablet Take 1 tablet by mouth daily.     oxymetazoline (AFRIN) 0.05 % nasal spray Place 1 spray into both nostrils 2 (two) times daily as needed for congestion.     tetrahydrozoline-zinc (VISINE-AC) 0.05-0.25 % ophthalmic solution Place 2 drops into both eyes 3 (three) times daily as needed (for dry eye).     traZODone (DESYREL) 100 MG tablet Take 100 mg by mouth at bedtime.     No current facility-administered medications for this visit.     Allergies as of 03/07/2019   (No Known Allergies)    Family History  Problem Relation Age of Onset   Hypertension Mother    Hypertension Sister    Hypertension Brother    Seizures Brother        Secondary to TBI   Hypertension Sister     Social History   Socioeconomic History   Marital status: Single    Spouse name: Not on file   Number of children: Not on file   Years of education: Not on file   Highest education level: Not on file  Occupational History   Not on file  Social Needs   Financial resource strain: Not on file   Food insecurity    Worry: Not on file    Inability: Not on file  Transportation needs    Medical: Not on file    Non-medical: Not on file  Tobacco Use   Smoking status: Never Smoker   Smokeless tobacco: Never Used  Substance and Sexual Activity   Alcohol use: Yes    Comment: occ   Drug use: Not Currently   Sexual activity: Not on file  Lifestyle   Physical activity    Days per week: Not on file    Minutes per session: Not on file   Stress: Not on file  Relationships   Social connections    Talks on phone: Not on file    Gets together: Not on file    Attends religious service: Not on file    Active member of club or organization: Not on file    Attends meetings of clubs or organizations: Not on file    Relationship status: Not on file   Intimate partner violence    Fear of current or ex partner: Not on file     Emotionally abused: Not on file    Physically abused: Not on file    Forced sexual activity: Not on file  Other Topics Concern   Not on file  Social History Narrative   Not on file    Review of Systems: Gen: Denies any fever, chills, fatigue, weight loss, lack of appetite.  CV: Denies chest pain, heart palpitations, peripheral edema, syncope.  Resp: Denies shortness of breath at rest or with exertion. Denies wheezing or cough.  GI: Denies dysphagia or odynophagia. Denies jaundice, hematemesis, fecal incontinence. GU : Denies urinary burning, urinary frequency, urinary hesitancy MS: Denies joint pain, muscle weakness, cramps, or limitation of movement.  Derm: Denies rash, itching, dry skin Psych: Denies depression, anxiety, memory loss, and confusion Heme: Denies bruising, bleeding, and enlarged lymph nodes.  Physical Exam: There were no vitals taken for this visit. General:   Alert and oriented. Pleasant and cooperative. Well-nourished and well-developed.  Head:  Normocephalic and atraumatic. Eyes:  Without icterus, sclera clear and conjunctiva pink.  Ears:  Normal auditory acuity. Nose:  No deformity, discharge,  or lesions. Mouth:  No deformity or lesions, oral mucosa pink.  Neck:  Supple, without mass or thyromegaly. Lungs:  Clear to auscultation bilaterally. No wheezes, rales, or rhonchi. No distress.  Heart:  S1, S2 present without murmurs appreciated.  Abdomen:  +BS, soft, non-tender and non-distended. No HSM noted. No guarding or rebound. No masses appreciated.  Rectal:  Deferred  Msk:  Symmetrical without gross deformities. Normal posture. Pulses:  Normal pulses noted. Extremities:  Without clubbing or edema. Neurologic:  Alert and  oriented x4;  grossly normal neurologically. Skin:  Intact without significant lesions or rashes. Cervical Nodes:  No significant cervical adenopathy. Psych:  Alert and cooperative. Normal mood and affect.

## 2019-03-07 ENCOUNTER — Ambulatory Visit: Payer: BC Managed Care – PPO | Admitting: Gastroenterology

## 2019-03-07 ENCOUNTER — Telehealth: Payer: Self-pay | Admitting: Gastroenterology

## 2019-03-07 ENCOUNTER — Encounter: Payer: Self-pay | Admitting: Gastroenterology

## 2019-03-07 NOTE — Telephone Encounter (Signed)
Patient was a no show and letter sent  °

## 2019-03-07 NOTE — Telephone Encounter (Signed)
Noted  

## 2019-03-11 ENCOUNTER — Other Ambulatory Visit (HOSPITAL_BASED_OUTPATIENT_CLINIC_OR_DEPARTMENT_OTHER): Payer: Self-pay

## 2019-03-11 DIAGNOSIS — G4733 Obstructive sleep apnea (adult) (pediatric): Secondary | ICD-10-CM

## 2019-03-25 ENCOUNTER — Encounter (HOSPITAL_COMMUNITY): Payer: Self-pay | Admitting: Emergency Medicine

## 2019-03-25 ENCOUNTER — Emergency Department (HOSPITAL_COMMUNITY): Payer: BC Managed Care – PPO

## 2019-03-25 ENCOUNTER — Other Ambulatory Visit: Payer: Self-pay

## 2019-03-25 ENCOUNTER — Emergency Department (HOSPITAL_COMMUNITY)
Admission: EM | Admit: 2019-03-25 | Discharge: 2019-03-25 | Disposition: A | Discharge status: Home / Self Care | Payer: BC Managed Care – PPO | Attending: Emergency Medicine | Admitting: Emergency Medicine

## 2019-03-25 DIAGNOSIS — Z8673 Personal history of transient ischemic attack (TIA), and cerebral infarction without residual deficits: Secondary | ICD-10-CM | POA: Insufficient documentation

## 2019-03-25 DIAGNOSIS — I10 Essential (primary) hypertension: Secondary | ICD-10-CM | POA: Diagnosis not present

## 2019-03-25 DIAGNOSIS — Z20828 Contact with and (suspected) exposure to other viral communicable diseases: Secondary | ICD-10-CM | POA: Diagnosis not present

## 2019-03-25 DIAGNOSIS — Z7982 Long term (current) use of aspirin: Secondary | ICD-10-CM | POA: Diagnosis not present

## 2019-03-25 DIAGNOSIS — R000 Tachycardia, unspecified: Secondary | ICD-10-CM | POA: Diagnosis not present

## 2019-03-25 DIAGNOSIS — Z79899 Other long term (current) drug therapy: Secondary | ICD-10-CM | POA: Diagnosis not present

## 2019-03-25 DIAGNOSIS — R569 Unspecified convulsions: Secondary | ICD-10-CM | POA: Diagnosis present

## 2019-03-25 LAB — BASIC METABOLIC PANEL
Anion gap: 15 (ref 5–15)
BUN: 10 mg/dL (ref 6–20)
CO2: 24 mmol/L (ref 22–32)
Calcium: 7.9 mg/dL — ABNORMAL LOW (ref 8.9–10.3)
Chloride: 99 mmol/L (ref 98–111)
Creatinine, Ser: 1.65 mg/dL — ABNORMAL HIGH (ref 0.61–1.24)
GFR calc Af Amer: 54 mL/min — ABNORMAL LOW (ref >60)
GFR calc non Af Amer: 46 mL/min — ABNORMAL LOW (ref >60)
Glucose, Bld: 96 mg/dL (ref 70–99)
Potassium: 3 mmol/L — ABNORMAL LOW (ref 3.5–5.1)
Sodium: 138 mmol/L (ref 135–145)

## 2019-03-25 LAB — CBG MONITORING, ED: Glucose-Capillary: 160 mg/dL — ABNORMAL HIGH (ref 70–99)

## 2019-03-25 LAB — CBC WITH DIFFERENTIAL/PLATELET
Abs Immature Granulocytes: 0.09 10*3/uL — ABNORMAL HIGH (ref 0.00–0.07)
Basophils Absolute: 0.1 10*3/uL (ref 0.0–0.1)
Basophils Relative: 1 %
Eosinophils Absolute: 0.1 10*3/uL (ref 0.0–0.5)
Eosinophils Relative: 1 %
HCT: 48.4 % (ref 39.0–52.0)
Hemoglobin: 15.4 g/dL (ref 13.0–17.0)
Immature Granulocytes: 1 %
Lymphocytes Relative: 30 %
Lymphs Abs: 4.4 10*3/uL — ABNORMAL HIGH (ref 0.7–4.0)
MCH: 30.6 pg (ref 26.0–34.0)
MCHC: 31.8 g/dL (ref 30.0–36.0)
MCV: 96.2 fL (ref 80.0–100.0)
Monocytes Absolute: 0.6 10*3/uL (ref 0.1–1.0)
Monocytes Relative: 4 %
Neutro Abs: 9.5 10*3/uL — ABNORMAL HIGH (ref 1.7–7.7)
Neutrophils Relative %: 63 %
Platelets: 338 10*3/uL (ref 150–400)
RBC: 5.03 MIL/uL (ref 4.22–5.81)
RDW: 14.2 % (ref 11.5–15.5)
WBC: 14.9 10*3/uL — ABNORMAL HIGH (ref 4.0–10.5)
nRBC: 0.5 % — ABNORMAL HIGH (ref 0.0–0.2)

## 2019-03-25 LAB — D-DIMER, QUANTITATIVE: D-Dimer, Quant: 1.46 ug/mL-FEU — ABNORMAL HIGH (ref 0.00–0.50)

## 2019-03-25 LAB — COMPREHENSIVE METABOLIC PANEL
ALT: 34 U/L (ref 0–44)
AST: 106 U/L — ABNORMAL HIGH (ref 15–41)
Albumin: 4.1 g/dL (ref 3.5–5.0)
Alkaline Phosphatase: 107 U/L (ref 38–126)
BUN: 10 mg/dL (ref 6–20)
CO2: 15 mmol/L — ABNORMAL LOW (ref 22–32)
Calcium: 8.4 mg/dL — ABNORMAL LOW (ref 8.9–10.3)
Chloride: 97 mmol/L — ABNORMAL LOW (ref 98–111)
Creatinine, Ser: 2.2 mg/dL — ABNORMAL HIGH (ref 0.61–1.24)
GFR calc Af Amer: 38 mL/min — ABNORMAL LOW (ref >60)
GFR calc non Af Amer: 33 mL/min — ABNORMAL LOW (ref >60)
Glucose, Bld: 232 mg/dL — ABNORMAL HIGH (ref 70–99)
Potassium: 3 mmol/L — ABNORMAL LOW (ref 3.5–5.1)
Sodium: 139 mmol/L (ref 135–145)
Total Bilirubin: 2.6 mg/dL — ABNORMAL HIGH (ref 0.3–1.2)
Total Protein: 8.5 g/dL — ABNORMAL HIGH (ref 6.5–8.1)

## 2019-03-25 LAB — POC SARS CORONAVIRUS 2 AG -  ED: SARS Coronavirus 2 Ag: NEGATIVE

## 2019-03-25 LAB — TSH: TSH: 1.296 u[IU]/mL (ref 0.350–4.500)

## 2019-03-25 LAB — ETHANOL: Alcohol, Ethyl (B): <10 mg/dL (ref <10)

## 2019-03-25 LAB — MAGNESIUM: Magnesium: 2 mg/dL (ref 1.7–2.4)

## 2019-03-25 MED ORDER — SODIUM CHLORIDE 0.9 % IV BOLUS
500.0000 mL | Freq: Once | INTRAVENOUS | Status: AC
  Administered 2019-03-25: 500 mL via INTRAVENOUS

## 2019-03-25 MED ORDER — LABETALOL HCL 5 MG/ML IV SOLN
20.0000 mg | Freq: Once | INTRAVENOUS | Status: AC
  Administered 2019-03-25: 20 mg via INTRAVENOUS
  Filled 2019-03-25: qty 4

## 2019-03-25 MED ORDER — LEVETIRACETAM 500 MG PO TABS: 500.0000 mg | ORAL_TABLET | Freq: Two times a day (BID) | ORAL | 0 refills | Status: DC

## 2019-03-25 MED ORDER — AMLODIPINE BESYLATE 5 MG PO TABS
10.0000 mg | ORAL_TABLET | Freq: Every day | ORAL | Status: DC
  Administered 2019-03-25: 15:00:00 10 mg via ORAL
  Filled 2019-03-25: qty 2

## 2019-03-25 MED ORDER — CARVEDILOL 12.5 MG PO TABS
25.0000 mg | ORAL_TABLET | Freq: Two times a day (BID) | ORAL | Status: DC
  Administered 2019-03-25: 25 mg via ORAL
  Filled 2019-03-25: qty 2

## 2019-03-25 MED ORDER — LORAZEPAM 2 MG/ML IJ SOLN
2.0000 mg | Freq: Once | INTRAMUSCULAR | Status: DC
  Filled 2019-03-25: qty 1

## 2019-03-25 MED ORDER — IOHEXOL 350 MG/ML SOLN
100.0000 mL | Freq: Once | INTRAVENOUS | Status: AC | PRN
  Administered 2019-03-25: 80 mL via INTRAVENOUS

## 2019-03-25 MED ORDER — POTASSIUM CHLORIDE 10 MEQ/100ML IV SOLN
10.0000 meq | Freq: Once | INTRAVENOUS | Status: AC
  Administered 2019-03-25: 10 meq via INTRAVENOUS
  Filled 2019-03-25: qty 100

## 2019-03-25 MED ORDER — IRBESARTAN 150 MG PO TABS
300.0000 mg | ORAL_TABLET | Freq: Every day | ORAL | Status: DC
  Administered 2019-03-25: 300 mg via ORAL
  Filled 2019-03-25 (×2): qty 1

## 2019-03-25 MED ORDER — LABETALOL HCL 5 MG/ML IV SOLN: 10.0000 mg | Freq: Once | INTRAVENOUS | Status: DC

## 2019-03-25 MED ORDER — AMLODIPINE-OLMESARTAN 10-40 MG PO TABS: 1.0000 | ORAL_TABLET | Freq: Every day | ORAL | Status: DC

## 2019-03-25 MED ORDER — LEVETIRACETAM IN NACL 1000 MG/100ML IV SOLN
1000.0000 mg | Freq: Once | INTRAVENOUS | Status: AC
  Administered 2019-03-25: 15:00:00 1000 mg via INTRAVENOUS
  Filled 2019-03-25: qty 100

## 2019-03-25 NOTE — ED Provider Notes (Signed)
[facility] EMERGENCY DEPARTMENT Provider Note   CSN: [id number] Arrival date & time: 03/25/19  3664     History   Chief Complaint Chief Complaint  Patient presents with  . Seizures    HPI Gabriel Zavala is a 53 y.o. male history of seizures, hypertension, high cholesterol, gout.  Patient presents today via EMS for seizure that [place] the [place] court [place].  Unknown length of seizure.  No medications given prior to arrival.  Patient reports that he is feeling well today and going to the [place] court house when suddenly he woke up in the back of an ambulance.  He reports something similar happened in July 2020 he was admitted to the hospital for seizure and TIA.  Patient reports that this time he has a mild diffuse throbbing headache constant no clear alleviating or aggravating factors.  Reports also mild sharp pain of his tongue from biting it during his seizure.  Denies fever/chills, vision changes, neck pain/stiffness, back pain, chest pain/shortness of breath, abdominal pain, nausea/vomiting, diarrhea, dysuria/hematuria, extremity pain/swelling, numbness/tingling, weakness or any additional concerns.    HPI  Past Medical History:  Diagnosis Date  . Gout   . History of nuclear stress test 06/2017   "No evidence of pharmacologically induced myocardial ischemia or prior MI"  . Hypercholesteremia   . Hypertension   . Seizure ([place]) 10/16/2018    Patient Active Problem List   Diagnosis Date Noted  . Seizure ([facility]) 10/16/2018  . AKI (acute kidney injury) ([place]) 10/16/2018  . Hypokalemia 10/16/2018  . Elevated bilirubin 10/16/2018  . Leukocytosis 10/16/2018  . TIA (transient ischemic attack) 10/16/2018  . Hyperglycemia 10/16/2018    Past Surgical History:  Procedure Laterality Date  . HERNIA REPAIR          Home Medications    Prior to Admission medications   Medication Sig Start Date End Date Taking? Authorizing Provider  allopurinol  (ZYLOPRIM) 300 MG tablet Take 300 mg by mouth daily.     [provider]  amLODipine-olmesartan (AZOR) 10-40 MG tablet Take 1 tablet by mouth daily.     [provider]  aspirin EC 81 MG tablet Take 81 mg by mouth daily.    [provider]  carvedilol (COREG) 25 MG tablet Take 25 mg by mouth 2 (two) times daily with a meal.     [provider]  cloNIDine (CATAPRES) 0.1 MG tablet Take 0.1 mg by mouth 2 (two) times daily.    [provider]  doxazosin (CARDURA) 2 MG tablet Take 2 mg by mouth 2 (two) times daily.     [provider]  fenofibrate (TRICOR) 145 MG tablet Take 145 mg by mouth once a week.     [provider]  gentamicin (GARAMYCIN) 0.3 % ophthalmic solution 1 drop 3 (three) times daily. 01/29/19   [provider]  Multiple Vitamin (MULTIVITAMIN WITH MINERALS) TABS tablet Take 1 tablet by mouth daily.    [provider]  oxymetazoline (AFRIN) 0.05 % nasal spray Place 1 spray into both nostrils 2 (two) times daily as needed for congestion.    [provider]  tetrahydrozoline-zinc (VISINE-AC) 0.05-0.25 % ophthalmic solution Place 2 drops into both eyes 3 (three) times daily as needed (for dry eye).    [provider]  traZODone (DESYREL) 100 MG tablet Take 100 mg by mouth at bedtime. 12/20/18   [provider]    Family History Family History  Problem Relation Age  of Onset  . Hypertension Mother   . Hypertension Sister   . Hypertension Brother   . Seizures Brother        Secondary to TBI  . Hypertension Sister     Social History Social History   Tobacco Use  . Smoking status: Never Smoker  . Smokeless tobacco: Never Used  Substance Use Topics  . Alcohol use: Yes    Comment: occ  . Drug use: Not Currently     Allergies   Patient has no known allergies.   Review of Systems Review of Systems Ten systems are reviewed and are negative for acute change except as noted  in the HPI   Physical Exam Updated Vital Signs BP (!) 167/122   Pulse (!) 117   Temp 98.7 F (37.1 C) (Oral)   Resp (!) 22   Ht  (1.753 m)   Wt 102.1 kg   SpO2 95%   BMI 33.23 kg/m   Physical Exam Constitutional:      General: He is not in acute distress.    Appearance: Normal appearance. He is well-developed. He is not ill-appearing or diaphoretic.  HENT:     Head: Normocephalic and atraumatic.     Jaw: There is normal jaw occlusion. No trismus.     Right Ear: External ear normal.     Left Ear: External ear normal.     Nose: Nose normal.     Mouth/Throat:     Mouth: Mucous membranes are moist.      Comments: 2 subcentimeter lacerations to the right side of the tongue, one subcentimeter laceration to the left side of the tongue, no active bleeding. Eyes:     General: Vision grossly intact. Gaze aligned appropriately.     Extraocular Movements: Extraocular movements intact.     Conjunctiva/sclera: Conjunctivae normal.     Pupils: Pupils are equal, round, and reactive to light.  Neck:     Musculoskeletal: Full passive range of motion without pain, normal range of motion and neck supple.     Trachea: Trachea and phonation normal. No tracheal deviation.  Cardiovascular:     Rate and Rhythm: Tachycardia present.     Pulses:          Dorsalis pedis pulses are 2+ on the right side and 2+ on the left side.     Heart sounds: Normal heart sounds.  Pulmonary:     Effort: Pulmonary effort is normal. No respiratory distress.     Breath sounds: Normal breath sounds and air entry.  Chest:     Chest wall: No tenderness.  Abdominal:     General: There is no distension.     Palpations: Abdomen is soft.     Tenderness: There is no abdominal tenderness. There is no guarding or rebound.  Musculoskeletal: Normal range of motion.     Comments: No midline C/T/L spinal tenderness to palpation, no paraspinal muscle tenderness, no deformity, crepitus, or step-off noted. No sign of  injury to the neck or back.  Feet:     Right foot:     Protective Sensation: 3 sites tested. 3 sites sensed.     Left foot:     Protective Sensation: 3 sites tested. 3 sites sensed.  Skin:    General: Skin is warm and dry.  Neurological:     Mental Status: He is alert.     GCS: GCS eye subscore is 4. GCS verbal subscore is 5. GCS motor subscore is 6.  Comments: Speech is clear and goal oriented, follows commands Major Cranial nerves without deficit, no facial droop Moves extremities without ataxia, coordination intact  Psychiatric:        Behavior: Behavior normal.      ED Treatments / Results  Labs (all labs ordered are listed, but only abnormal results are displayed) Labs Reviewed  CBC WITH DIFFERENTIAL/PLATELET - Abnormal; Notable for the following components:      Result Value   WBC 14.9 (*)    nRBC 0.5 (*)    Neutro Abs 9.5 (*)    Lymphs Abs 4.4 (*)    Abs Immature Granulocytes 0.09 (*)    All other components within normal limits  COMPREHENSIVE METABOLIC PANEL - Abnormal; Notable for the following components:   Potassium 3.0 (*)    Chloride 97 (*)    CO2 15 (*)    Glucose, Bld 232 (*)    Creatinine, Ser 2.20 (*)    Calcium 8.4 (*)    Total Protein 8.5 (*)    AST 106 (*)    Total Bilirubin 2.6 (*)    GFR calc non Af Amer 33 (*)    GFR calc Af Amer 38 (*)    All other components within normal limits  D-DIMER, QUANTITATIVE (NOT AT [facility]) - Abnormal; Notable for the following components:   D-Dimer, Quant 1.46 (*)    All other components within normal limits  CBG MONITORING, ED - Abnormal; Notable for the following components:   Glucose-Capillary 160 (*)    All other components within normal limits  MAGNESIUM  ETHANOL  TSH  RAPID URINE DRUG SCREEN, HOSP PERFORMED  URINALYSIS, ROUTINE W REFLEX MICROSCOPIC  BASIC METABOLIC PANEL  POC SARS CORONAVIRUS 2 AG -  ED  I-STAT CHEM 8, ED    EKG EKG Interpretation  Date/Time:  Tuesday March 25 2019 09:56:39  EST Ventricular Rate:  147 PR Interval:    QRS Duration: 81 QT Interval:  279 QTC Calculation: 437 R Axis:   -32 Text Interpretation: Sinus tachycardia Ventricular premature complex Aberrant complex LAE, consider biatrial enlargement Left axis deviation Abnormal T, consider ischemia, lateral leads Confirmed by Donnetta Hutchingook, Brian ([id number]) on 03/25/2019 11:08:43 AM   Radiology Ct Angio Chest Pe W And/or Wo Contrast  Result Date: 03/25/2019 CLINICAL DATA:  Seizure, wheezing, rule out PE EXAM: CT ANGIOGRAPHY CHEST WITH CONTRAST TECHNIQUE: Multidetector CT imaging of the chest was performed using the standard protocol during bolus administration of intravenous contrast. Multiplanar CT image reconstructions and MIPs were obtained to evaluate the vascular anatomy. CONTRAST:  80mL OMNIPAQUE IOHEXOL 350 MG/ML SOLN COMPARISON:  None. FINDINGS: Cardiovascular: Satisfactory opacification of the pulmonary arteries to the segmental level. No evidence of pulmonary embolism. Normal heart size. No pericardial effusion. Minimal aortic atherosclerosis. Mediastinum/Nodes: No enlarged mediastinal, hilar, or axillary lymph nodes. Thyroid gland, trachea, and esophagus demonstrate no significant findings. Lungs/Pleura: Lungs are clear. No pleural effusion or pneumothorax. Upper Abdomen: No acute abnormality. Hepatic steatosis. Musculoskeletal: No chest wall abnormality. No acute or significant osseous findings. Review of the MIP images confirms the above findings. IMPRESSION: 1. Negative examination for pulmonary embolism. 2. Hepatic steatosis. 3. Minimal aortic atherosclerosis. Aortic Atherosclerosis (ICD10-I70.0). Electronically Signed   By: Lauralyn PrimesAlex  Bibbey M.D.   On: 03/25/2019 13:15   Dg Chest Portable 1 View  Result Date: 03/25/2019 CLINICAL DATA:  Tachycardia EXAM: PORTABLE CHEST 1 VIEW COMPARISON:  03/11/2019 FINDINGS: The heart size and mediastinal contours are within normal limits. Both lungs are clear. The visualized  skeletal structures are unremarkable. IMPRESSION: No active disease. Electronically Signed   By: Marlan Palau M.D.   On: 03/25/2019 11:50    Procedures Procedures (including critical care time)  Medications Ordered in ED Medications  LORazepam (ATIVAN) injection 2 mg (2 mg Intravenous Refused 03/25/19 1113)  carvedilol (COREG) tablet 25 mg (25 mg Oral Given 03/25/19 1656)  irbesartan (AVAPRO) tablet 300 mg (300 mg Oral Given 03/25/19 1449)  amLODipine (NORVASC) tablet 10 mg (10 mg Oral Given 03/25/19 1448)  sodium chloride 0.9 % bolus 500 mL (0 mLs Intravenous Stopped 03/25/19 1437)  iohexol (OMNIPAQUE) 350 MG/ML injection 100 mL (80 mLs Intravenous Contrast Given 03/25/19 1253)  levETIRAcetam (KEPPRA) IVPB 1000 mg/100 mL premix (0 mg Intravenous Stopped 03/25/19 1541)  sodium chloride 0.9 % bolus 500 mL (500 mLs Intravenous New Bag/Given 03/25/19 1544)  potassium chloride 10 mEq in 100 mL IVPB (0 mEq Intravenous Stopped 03/25/19 1655)  sodium chloride 0.9 % bolus 500 mL (500 mLs Intravenous New Bag/Given 03/25/19 1543)  labetalol (NORMODYNE) injection 20 mg (20 mg Intravenous Given 03/25/19 1652)     Initial Impression / Assessment and Plan / ED Course  I have reviewed the triage vital signs and the nursing notes.  Pertinent labs & imaging results that were available during my care of the patient were reviewed by me and considered in my medical decision making (see chart for details).    Chart review shows patient had ED admission on 10/16/2018, he was admitted to the hospital for 1 day.  Per discharge note it appears patient had a seizure at home, woke up in the hospital and had left-sided hemiparesis which resolved after he arrived to the ED.  He was admitted for new onset seizure with possible TIA.  MRI brain showed multiple small distant CVAs nothing acute.  Hypokalemia was repleted, EEG the next morning did not show seizure-like activity.  Apparently Dr. Gerilyn Pilgrim was consulted and it was not  felt that antiepileptics were indicated at that time.  Patient was advised to continue blood pressure medications and aspirin. - Patient arrives today with his second lifetime seizure, witnessed and from the court house patient was postictal to EMS.  On ED arrival he is well-appearing in no acute distress somewhat tired but has no specific complaints.  No neuro deficits on exam he is resting comfortably and in no acute distress.  He has 3 small lacerations on his tongue which are nonbleeding and do not necessitate repair.  Patient is tachycardic however this may be due to recent stressful event/seizure, will give fluid bolus obtain laboratory work-up and reassess. - CBC shows leukocytosis of 14.9 with left shift, suspect this is secondary to seizure activity today he has no recent illness history and is afebrile suspicion for infection at this time. CMP shows creatinine 2.2 slightly above baseline will give fluid bolus, glucose elevated, potassium 3.0 will replete Magnesium within normal limits Ethanol negative CBG 160 Covid negative DDimer 1.46 CXR:  IMPRESSION:  No active disease.   CT Angio PE Study:  IMPRESSION:  1. Negative examination for pulmonary embolism.  2. Hepatic steatosis.  3. Minimal aortic atherosclerosis. Aortic Atherosclerosis  (ICD10-I70.0).  - Patient's heart rate continue to be elevated in the ED, he has not taken his home medications today as he reports he was running late to court.  Discussed case with Dr. Adriana Simas, plan to give home medications and reassess as patient has not yet had his beta-blocker today.  Advised addition of TSH. - Discussed  case with neurologist Dr. Gerilyn Pilgrim who advises starting patient on Keppra 500 mg twice daily and follow-up with his office as outpatient. - Patient reassessed resting comfortably no acute distress reports he is feeling well medically at home he remains tachycardic approximately 115 bpm on the monitor he denies any chest pain or  shortness of breath reports that he is feeling well and has no complaints.  Discussed case with Dr. Estell Harpin who is attending at shift change, will give additional fluids and reassess. - TSH within normal limits - Repeat BMP ordered. - Patient re-evaluated, resting comfortably and in no acute distress. Reports that he feels well and has no complaints at this time. Remains tachycardic at 130bmp. Denies any symptoms at this time. He reports he wants to go home but is agreeable to continued evaluation and treatment at this time. - Care handoff given to Dr. Estell Harpin at shift-change. Disposition per on-coming team.   Note: Portions of this report may have been transcribed using voice recognition software. Every effort was made to ensure accuracy; however, inadvertent computerized transcription errors may still be present. Final Clinical Impressions(s) / ED Diagnoses   Final diagnoses:  None    ED Discharge Orders    None       Elizabeth Palau 03/25/19 1657    Donnetta Hutching, MD 03/28/19 [phone]

## 2019-03-25 NOTE — ED Triage Notes (Signed)
Per EMS [place] Witnesses say patient started seizing and went out. EMS arrived when patient was post ictal. No loss of continence but patient reports headache and mouth trauma from biting tongue. Patient is alert x 4 in triage.

## 2019-03-25 NOTE — Discharge Instructions (Addendum)
Follow up with Dr. Merlene Laughter next week.  Return if problems

## 2019-04-02 ENCOUNTER — Other Ambulatory Visit (HOSPITAL_COMMUNITY)
Admission: RE | Admit: 2019-04-02 | Discharge: 2019-04-02 | Disposition: A | Discharge status: Home / Self Care | Payer: BC Managed Care – PPO | Source: Ambulatory Visit | Attending: Neurology | Admitting: Neurology

## 2019-04-17 NOTE — Progress Notes (Signed)
CARDIOLOGY CONSULT NOTE       Patient ID: Gabriel Zavala MRN: [id number] DOB/AGE: 18-Dec-1965 53 y.o.  Admit date: (Not on file) Referring Physician: Sherril Croon and [place].C[place] Primary Physician: Ignatius Specking, MD Primary Cardiologist: New Reason for Consultation: Syncope     Active Problems:   * No active hospital problems. *   HPI:  53 y.o. referred by Dr Sherril Croon and Gerilyn Pilgrim for syncope. No previously documented arrhythmia or cardiac issues. He has had documented seizures first noted in July 2020 generalized with loss of memory And left sided hemiparesis that resolved MRI with no acute findings some small multiple CVAls in past EEG With no seizure activity He has HTN and HLD He was not d/c on anti - seizure meds and f/u with neurology  03/25/19 he had 2nd witnessed seizure post ictal and bit tongue Was going to court house and next thing he new he was in ambulance He was d/c on Keppra at that time In ER both times no acute cardiac issues no arrhythmia or heart block on telemetry and negative troponin   Echo done 10/17/18 showed normal EF 55-60% no bubble study but no PFO by 2D and color flow Carotids with plaque no stenosis   He has no chest pain palpitations or aura. Discussed possible utility of ILR  Interestingly his ECG from 2 nd ER vist was tachycardic at rate 147 narrow complex and to my  Eye I cannot r/o atrial tachycardia. However he was asymptomatic with it and it was not Rx ECGT In office today shows ? Inappropriate sinus tachycardia at rate 113   He has not been taking BP meds and has not filled Keppra Says he had postural symptoms and feels better With higher BP. I discussed issues with ? Previous silent lacunar strokes due to poorly controlled BP   ROS All other systems reviewed and negative except as noted above  Past Medical History:  Diagnosis Date  . Gout   . History of nuclear stress test 06/2017   "No evidence of pharmacologically induced myocardial  ischemia or prior MI"  . Hypercholesteremia   . Hypertension   . Seizure ([facility]) 10/16/2018    Family History  Problem Relation Age of Onset  . Hypertension Mother   . Hypertension Sister   . Hypertension Brother   . Seizures Brother        Secondary to TBI  . Hypertension Sister     Social History   Socioeconomic History  . Marital status: Single    Spouse name: Not on file  . Number of children: Not on file  . Years of education: Not on file  . Highest education level: Not on file  Occupational History  . Not on file  Tobacco Use  . Smoking status: Never Smoker  . Smokeless tobacco: Never Used  Substance and Sexual Activity  . Alcohol use: Yes    Comment: occ  . Drug use: Not Currently  . Sexual activity: Not on file  Other Topics Concern  . Not on file  Social History Narrative  . Not on file   Social Determinants of Health   Financial Resource Strain:   . Difficulty of Paying Living Expenses: Not on file  Food Insecurity:   . Worried About Programme researcher, broadcasting/film/video in the Last Year: Not on file  . Ran Out of Food in the Last Year: Not on file  Transportation Needs:   . Lack of Transportation (Medical): Not on file  .  Lack of Transportation (Non-Medical): Not on file  Physical Activity:   . Days of Exercise per Week: Not on file  . Minutes of Exercise per Session: Not on file  Stress:   . Feeling of Stress : Not on file  Social Connections:   . Frequency of Communication with Friends and Family: Not on file  . Frequency of Social Gatherings with Friends and Family: Not on file  . Attends Religious Services: Not on file  . Active Member of Clubs or Organizations: Not on file  . Attends Archivist Meetings: Not on file  . Marital Status: Not on file  Intimate Partner Violence:   . Fear of Current or Ex-Partner: Not on file  . Emotionally Abused: Not on file  . Physically Abused: Not on file  . Sexually Abused: Not on file    Past Surgical History:    Procedure Laterality Date  . HERNIA REPAIR       Current Outpatient Medications:  .  allopurinol (ZYLOPRIM) 300 MG tablet, Take 300 mg by mouth daily. , Disp: , Rfl:  .  amLODipine-olmesartan (AZOR) 10-40 MG tablet, Take 1 tablet by mouth daily. , Disp: , Rfl:  .  aspirin EC 81 MG tablet, Take 81 mg by mouth daily., Disp: , Rfl:  .  carvedilol (COREG) 25 MG tablet, Take 25 mg by mouth 2 (two) times daily with a meal. , Disp: , Rfl:  .  fenofibrate (TRICOR) 145 MG tablet, Take 145 mg by mouth once a week. , Disp: , Rfl:  .  Multiple Vitamin (MULTIVITAMIN WITH MINERALS) TABS tablet, Take 1 tablet by mouth daily., Disp: , Rfl:  .  traZODone (DESYREL) 100 MG tablet, Take 100 mg by mouth at bedtime., Disp: , Rfl:     Physical Exam: Blood pressure (!) 220/120, pulse (!) 120, temperature 98.2 F (36.8 C), temperature source Oral, height 5\' 9"  (1.753 m), weight 211 lb 6.4 oz (95.9 kg), SpO2 98 %.   Affect appropriate Healthy:  appears stated age 53: normal Neck supple with no adenopathy JVP normal no bruits no thyromegaly Lungs clear with no wheezing and good diaphragmatic motion Heart:  S1/S2 no murmur, no rub, gallop or click PMI normal Abdomen: benighn, BS positve, no tenderness, no AAA no bruit.  No HSM or HJR Distal pulses intact with no bruits No edema Neuro non-focal Skin warm and dry No muscular weakness   Labs:   Lab Results  Component Value Date   WBC 14.9 (H) 03/25/2019   HGB 15.4 03/25/2019   HCT 48.4 03/25/2019   MCV 96.2 03/25/2019   PLT 338 03/25/2019   No results for input(s): NA, K, CL, CO2, BUN, CREATININE, CALCIUM, PROT, BILITOT, ALKPHOS, ALT, AST, GLUCOSE in the last 168 hours.  Invalid input(s): LABALBU Lab Results  Component Value Date   TROPONINI <0.03 06/27/2018    Lab Results  Component Value Date   CHOL 291 (H) 10/17/2018   Lab Results  Component Value Date   HDL 30 (L) 10/17/2018   Lab Results  Component Value Date   LDLCALC 230  (H) 10/17/2018   Lab Results  Component Value Date   TRIG 157 (H) 10/17/2018   Lab Results  Component Value Date   CHOLHDL 9.7 10/17/2018   No results found for: LDLDIRECT    Radiology: CT Angio Chest PE W and/or Wo Contrast  Result Date: 03/25/2019 CLINICAL DATA:  Seizure, wheezing, rule out PE EXAM: CT ANGIOGRAPHY CHEST WITH CONTRAST TECHNIQUE: Multidetector  CT imaging of the chest was performed using the standard protocol during bolus administration of intravenous contrast. Multiplanar CT image reconstructions and MIPs were obtained to evaluate the vascular anatomy. CONTRAST:  80mL OMNIPAQUE IOHEXOL 350 MG/ML SOLN COMPARISON:  None. FINDINGS: Cardiovascular: Satisfactory opacification of the pulmonary arteries to the segmental level. No evidence of pulmonary embolism. Normal heart size. No pericardial effusion. Minimal aortic atherosclerosis. Mediastinum/Nodes: No enlarged mediastinal, hilar, or axillary lymph nodes. Thyroid gland, trachea, and esophagus demonstrate no significant findings. Lungs/Pleura: Lungs are clear. No pleural effusion or pneumothorax. Upper Abdomen: No acute abnormality. Hepatic steatosis. Musculoskeletal: No chest wall abnormality. No acute or significant osseous findings. Review of the MIP images confirms the above findings. IMPRESSION: 1. Negative examination for pulmonary embolism. 2. Hepatic steatosis. 3. Minimal aortic atherosclerosis. Aortic Atherosclerosis (ICD10-I70.0). Electronically Signed   By: Lauralyn PrimesAlex  Bibbey M.D.   On: 03/25/2019 13:15   DG Chest Portable 1 View  Result Date: 03/25/2019 CLINICAL DATA:  Tachycardia EXAM: PORTABLE CHEST 1 VIEW COMPARISON:  03/11/2019 FINDINGS: The heart size and mediastinal contours are within normal limits. Both lungs are clear. The visualized skeletal structures are unremarkable. IMPRESSION: No active disease. Electronically Signed   By: Marlan Palauharles  Clark M.D.   On: 03/25/2019 11:50    EKG: 03/26/19 ? Atrial tachycardia narrow  complex rate 147    ASSESSMENT AND PLAN:   1. "Syncope"  Clinically this appears to be poorly Rx seizures. However lack of foci on EEG is concerning His echo shows normal EF and he has not had any chest pain He is supposed to be on coreg and not taking. He was not postural to my exam today.  He does have marked variation in HR with vagal maneuver and breath hold leading to slowing of HR Given abnormal ECG on 2nd ER visit with ? Atrial tachycardia would favor EP referral for ? ILR ECG today is more consistent with sinus tachycardia at rate of 113   2. Seizures:  F/u neurology encouraged to pick up script for  Keppra  3. HTN:  Continue norvasc start labetalol he indicates not taking clonidine f/u with primary   4. HLD:  F/u with primary currently on tricor   Patient will f/u with EP as primary issue is tachycardia ? Inappropriate and syncope of ? Unknown etiology possible Need for ILR.    SignedCharlton Haws: Jaylene Schrom 04/23/2019, 9:15 AM

## 2019-04-23 ENCOUNTER — Ambulatory Visit: Payer: BC Managed Care – PPO | Admitting: Cardiovascular Disease

## 2019-04-23 ENCOUNTER — Encounter: Payer: Self-pay | Admitting: Cardiovascular Disease

## 2019-04-23 ENCOUNTER — Other Ambulatory Visit: Payer: Self-pay

## 2019-04-23 VITALS — BP 220/120 | HR 120 | Temp 98.2°F | Ht 69.0 in | Wt 211.4 lb

## 2019-04-23 DIAGNOSIS — R9431 Abnormal electrocardiogram [ECG] [EKG]: Secondary | ICD-10-CM | POA: Diagnosis not present

## 2019-04-23 DIAGNOSIS — R55 Syncope and collapse: Secondary | ICD-10-CM

## 2019-04-23 MED ORDER — LABETALOL HCL 200 MG PO TABS: 400.0000 mg | ORAL_TABLET | Freq: Two times a day (BID) | ORAL | 6 refills | Status: DC

## 2019-04-23 NOTE — Patient Instructions (Signed)
Medication Instructions:  Your physician has recommended you make the following change in your medication:  Start Labetalol 400 mg Two Times Daily  Stop Taking Coreg   *If you need a refill on your cardiac medications before your next appointment, please call your pharmacy*  Lab Work: NONE  If you have labs (blood work) drawn today and your tests are completely normal, you will receive your results only by: Marland Kitchen MyChart Message (if you have MyChart) OR . A paper copy in the mail If you have any lab test that is abnormal or we need to change your treatment, we will call you to review the results.  Testing/Procedures: NONE   Follow-Up: At [facility], you and your health needs are our priority.  As part of our continuing mission to provide you with exceptional heart care, we have created designated Provider Care Teams.  These Care Teams include your primary Cardiologist (physician) and Advanced Practice Providers (APPs -  Physician Assistants and Nurse Practitioners) who all work together to provide you with the care you need, when you need it.  Your next appointment:    Next Available   The format for your next appointment:   In Person  Provider:   Lewayne Bunting, MD  Other Instructions Thank you for choosing [facility]!

## 2019-04-24 ENCOUNTER — Telehealth: Payer: Self-pay | Admitting: Cardiovascular Disease

## 2019-04-24 NOTE — Telephone Encounter (Signed)
Please give pt a call to review his medication

## 2019-04-24 NOTE — Telephone Encounter (Signed)
Called and spoke to patient. He states that he went to pick up his prescriptions from yesterday. He states that he was able to get his labetalol but not the keppra. Instructed patient to reach out to his neurologist for the prescription for keppra. Patient verbalized understanding and thanked me for the call.

## 2019-04-30 ENCOUNTER — Encounter: Payer: Self-pay | Admitting: Internal Medicine

## 2019-04-30 ENCOUNTER — Ambulatory Visit: Payer: BC Managed Care – PPO | Admitting: Internal Medicine

## 2019-04-30 VITALS — BP 123/89 | HR 87 | Temp 97.3°F | Ht 69.0 in | Wt 201.0 lb

## 2019-04-30 DIAGNOSIS — R55 Syncope and collapse: Secondary | ICD-10-CM | POA: Diagnosis not present

## 2019-04-30 MED ORDER — METOPROLOL SUCCINATE ER 50 MG PO TB24: 50.0000 mg | ORAL_TABLET | Freq: Every day | ORAL | 3 refills | Status: DC

## 2019-04-30 MED ORDER — CLONIDINE HCL 0.1 MG PO TABS: 0.1000 mg | ORAL_TABLET | Freq: Two times a day (BID) | ORAL | 11 refills | Status: DC

## 2019-04-30 NOTE — Progress Notes (Signed)
HPI Gabriel Zavala is referred today by Dr. Johnsie Cancel for evaluation of syncope and to consider ILR insertion. He is a pleasant middle aged man with a h/o HTN who started experiencing syncope almost 2 years ago. He has been diagnosed with seizures in the past and has been on keppra though this was stopped. He has been seen by his neurologist and reports that his blood pressure dropped "A lot" when he was undergoing orthostatic vitals. He has never passed out sitting or lying down. The spells always occur when he stands up. He has never had a loss of bowel or bladder function. Never injured himself. He has had multiple different bp meds and is currently on labetolol and amlodipine and an ARB. Review of his MRI demonstrates atrophy. Small vessel disease.  No Known Allergies   Current Outpatient Medications  Medication Sig Dispense Refill  . allopurinol (ZYLOPRIM) 300 MG tablet Take 300 mg by mouth daily.     Marland Kitchen amLODipine-olmesartan (AZOR) 10-40 MG tablet Take 1 tablet by mouth daily.     Marland Kitchen aspirin EC 81 MG tablet Take 81 mg by mouth daily.    . fenofibrate (TRICOR) 145 MG tablet Take 145 mg by mouth once a week.     . labetalol (NORMODYNE) 200 MG tablet Take 2 tablets (400 mg total) by mouth 2 (two) times daily. 120 tablet 6  . Multiple Vitamin (MULTIVITAMIN WITH MINERALS) TABS tablet Take 1 tablet by mouth daily.    . traZODone (DESYREL) 100 MG tablet Take 100 mg by mouth at bedtime.     No current facility-administered medications for this visit.     Past Medical History:  Diagnosis Date  . Gout   . History of nuclear stress test 06/2017   "No evidence of pharmacologically induced myocardial ischemia or prior MI"  . Hypercholesteremia   . Hypertension   . Seizure ([place]) 10/16/2018    ROS:   All systems reviewed and negative except as noted in the HPI.   Past Surgical History:  Procedure Laterality Date  . HERNIA REPAIR       Family History  Problem Relation Age of Onset   . Hypertension Mother   . Hypertension Sister   . Hypertension Brother   . Seizures Brother        Secondary to TBI  . Hypertension Sister      Social History   Socioeconomic History  . Marital status: Single    Spouse name: Not on file  . Number of children: Not on file  . Years of education: Not on file  . Highest education level: Not on file  Occupational History  . Not on file  Tobacco Use  . Smoking status: Never Smoker  . Smokeless tobacco: Never Used  Substance and Sexual Activity  . Alcohol use: Yes    Comment: occ  . Drug use: Not Currently  . Sexual activity: Not on file  Other Topics Concern  . Not on file  Social History Narrative  . Not on file   Social Determinants of Health   Financial Resource Strain:   . Difficulty of Paying Living Expenses: Not on file  Food Insecurity:   . Worried About Charity fundraiser in the Last Year: Not on file  . Ran Out of Food in the Last Year: Not on file  Transportation Needs:   . Lack of Transportation (Medical): Not on file  . Lack of Transportation (Non-Medical): Not on file  Physical Activity:   . Days of Exercise per Week: Not on file  . Minutes of Exercise per Session: Not on file  Stress:   . Feeling of Stress : Not on file  Social Connections:   . Frequency of Communication with Friends and Family: Not on file  . Frequency of Social Gatherings with Friends and Family: Not on file  . Attends Religious Services: Not on file  . Active Member of Clubs or Organizations: Not on file  . Attends Banker Meetings: Not on file  . Marital Status: Not on file  Intimate Partner Violence:   . Fear of Current or Ex-Partner: Not on file  . Emotionally Abused: Not on file  . Physically Abused: Not on file  . Sexually Abused: Not on file     BP 123/89   Pulse 87   Temp (!) 97.3 F (36.3 C)   Ht 5\' 9"  (1.753 m)   Wt 201 lb (91.2 kg)   SpO2 98%   BMI 29.68 kg/m   Physical Exam:  Well appearing  NAD HEENT: Unremarkable Neck:  No JVD, no thyromegally Lymphatics:  No adenopathy Back:  No CVA tenderness Lungs:  Clear with no wheezes HEART:  Regular rate rhythm, no murmurs, no rubs, no clicks Abd:  soft, positive bowel sounds, no organomegally, no rebound, no guarding Ext:  2 plus pulses, no edema, no cyanosis, no clubbing Skin:  No rashes no nodules Neuro:  CN II through XII intact, motor grossly intact  DEVICE  Normal device function.  See PaceArt for details.   Assess/Plan: 1. Syncope - the patient has autonomic dysfunction. He has demonstrable sinus tachy and orthostasis. I have discussed sodium intake and his bp meds will be adjusted. He does not need vasodilators.   2. HTN - he will stop labetolol and switch to toprol. He will stop amlodipine and switch to clonidine. He will return in 2 weeks for a bp check. 3. Inappropriate sinus tachy - his symptoms should improve with clonidine and toprol.

## 2019-04-30 NOTE — Patient Instructions (Addendum)
Medication Instructions:  Your physician has recommended you make the following change in your medication:   Stop Taking Amlodipine -Olmesartan  Stop Taking Labetalol   Start Taking Toprol XL 50 mg Daily  Start Taking Clonidine 0.1mg  Two Times Daily   *If you need a refill on your cardiac medications before your next appointment, please call your pharmacy*  Lab Work: NONE  If you have labs (blood work) drawn today and your tests are completely normal, you will receive your results only by: Marland Kitchen MyChart Message (if you have MyChart) OR . A paper copy in the mail If you have any lab test that is abnormal or we need to change your treatment, we will call you to review the results.  Testing/Procedures: NONE   Follow-Up: At [facility], you and your health needs are our priority.  As part of our continuing mission to provide you with exceptional heart care, we have created designated Provider Care Teams.  These Care Teams include your primary Cardiologist (physician) and Advanced Practice Providers (APPs -  Physician Assistants and Nurse Practitioners) who all work together to provide you with the care you need, when you need it.  Your next appointment:   6 week(s)  The format for your next appointment:   In Person  Provider:   Lewayne Bunting, MD  Other Instructions Your physician recommends that you schedule a follow-up appointment in: 2 Weeks with nurse visit   Wear Support Stockings   Thank you for choosing [facility]!

## 2019-05-13 ENCOUNTER — Encounter (HOSPITAL_COMMUNITY): Payer: Self-pay | Admitting: *Deleted

## 2019-05-13 ENCOUNTER — Other Ambulatory Visit: Payer: Self-pay

## 2019-05-13 ENCOUNTER — Emergency Department (HOSPITAL_COMMUNITY): Payer: BC Managed Care – PPO

## 2019-05-13 ENCOUNTER — Inpatient Hospital Stay (HOSPITAL_COMMUNITY)
Admission: EM | Admit: 2019-05-13 | Discharge: 2019-05-17 | DRG: 897 | Disposition: A | Discharge status: Home / Self Care | Payer: BC Managed Care – PPO | Attending: Internal Medicine | Admitting: Internal Medicine

## 2019-05-13 DIAGNOSIS — Z20822 Contact with and (suspected) exposure to covid-19: Secondary | ICD-10-CM | POA: Diagnosis present

## 2019-05-13 DIAGNOSIS — R569 Unspecified convulsions: Secondary | ICD-10-CM | POA: Diagnosis present

## 2019-05-13 DIAGNOSIS — K2920 Alcoholic gastritis without bleeding: Secondary | ICD-10-CM | POA: Diagnosis present

## 2019-05-13 DIAGNOSIS — R7302 Impaired glucose tolerance (oral): Secondary | ICD-10-CM

## 2019-05-13 DIAGNOSIS — F1010 Alcohol abuse, uncomplicated: Secondary | ICD-10-CM

## 2019-05-13 DIAGNOSIS — Z7982 Long term (current) use of aspirin: Secondary | ICD-10-CM

## 2019-05-13 DIAGNOSIS — I160 Hypertensive urgency: Secondary | ICD-10-CM | POA: Diagnosis present

## 2019-05-13 DIAGNOSIS — R1110 Vomiting, unspecified: Secondary | ICD-10-CM

## 2019-05-13 DIAGNOSIS — F10230 Alcohol dependence with withdrawal, uncomplicated: Secondary | ICD-10-CM

## 2019-05-13 DIAGNOSIS — G909 Disorder of the autonomic nervous system, unspecified: Secondary | ICD-10-CM | POA: Diagnosis present

## 2019-05-13 DIAGNOSIS — F10239 Alcohol dependence with withdrawal, unspecified: Secondary | ICD-10-CM | POA: Diagnosis present

## 2019-05-13 DIAGNOSIS — Z7289 Other problems related to lifestyle: Secondary | ICD-10-CM

## 2019-05-13 DIAGNOSIS — F1090 Alcohol use, unspecified, uncomplicated: Secondary | ICD-10-CM

## 2019-05-13 DIAGNOSIS — E7800 Pure hypercholesterolemia, unspecified: Secondary | ICD-10-CM | POA: Diagnosis present

## 2019-05-13 DIAGNOSIS — Z789 Other specified health status: Secondary | ICD-10-CM

## 2019-05-13 DIAGNOSIS — W010XXA Fall on same level from slipping, tripping and stumbling without subsequent striking against object, initial encounter: Secondary | ICD-10-CM | POA: Diagnosis present

## 2019-05-13 DIAGNOSIS — Z79899 Other long term (current) drug therapy: Secondary | ICD-10-CM

## 2019-05-13 DIAGNOSIS — M109 Gout, unspecified: Secondary | ICD-10-CM | POA: Diagnosis present

## 2019-05-13 DIAGNOSIS — Y906 Blood alcohol level of 120-199 mg/100 ml: Secondary | ICD-10-CM | POA: Diagnosis present

## 2019-05-13 DIAGNOSIS — M25562 Pain in left knee: Secondary | ICD-10-CM

## 2019-05-13 DIAGNOSIS — E872 Acidosis, unspecified: Secondary | ICD-10-CM | POA: Diagnosis present

## 2019-05-13 DIAGNOSIS — F10229 Alcohol dependence with intoxication, unspecified: Principal | ICD-10-CM | POA: Diagnosis present

## 2019-05-13 DIAGNOSIS — R739 Hyperglycemia, unspecified: Secondary | ICD-10-CM | POA: Diagnosis present

## 2019-05-13 DIAGNOSIS — I10 Essential (primary) hypertension: Secondary | ICD-10-CM | POA: Diagnosis present

## 2019-05-13 DIAGNOSIS — M25561 Pain in right knee: Secondary | ICD-10-CM

## 2019-05-13 DIAGNOSIS — K709 Alcoholic liver disease, unspecified: Secondary | ICD-10-CM | POA: Diagnosis present

## 2019-05-13 DIAGNOSIS — E860 Dehydration: Secondary | ICD-10-CM | POA: Diagnosis present

## 2019-05-13 DIAGNOSIS — R112 Nausea with vomiting, unspecified: Secondary | ICD-10-CM

## 2019-05-13 DIAGNOSIS — Z8249 Family history of ischemic heart disease and other diseases of the circulatory system: Secondary | ICD-10-CM

## 2019-05-13 DIAGNOSIS — R17 Unspecified jaundice: Secondary | ICD-10-CM | POA: Diagnosis present

## 2019-05-13 LAB — CBC
HCT: 42.9 % (ref 39.0–52.0)
Hemoglobin: 14.1 g/dL (ref 13.0–17.0)
MCH: 30.3 pg (ref 26.0–34.0)
MCHC: 32.9 g/dL (ref 30.0–36.0)
MCV: 92.3 fL (ref 80.0–100.0)
Platelets: 394 10*3/uL (ref 150–400)
RBC: 4.65 MIL/uL (ref 4.22–5.81)
RDW: 15.7 % — ABNORMAL HIGH (ref 11.5–15.5)
WBC: 10.6 10*3/uL — ABNORMAL HIGH (ref 4.0–10.5)
nRBC: 0 % (ref 0.0–0.2)

## 2019-05-13 LAB — ETHANOL: Alcohol, Ethyl (B): 131 mg/dL — ABNORMAL HIGH (ref <10)

## 2019-05-13 MED ORDER — ONDANSETRON HCL 4 MG/2ML IJ SOLN
4.0000 mg | Freq: Once | INTRAMUSCULAR | Status: AC
  Administered 2019-05-13: 4 mg via INTRAVENOUS
  Filled 2019-05-13: qty 2

## 2019-05-13 MED ORDER — SODIUM CHLORIDE 0.9 % IV BOLUS (SEPSIS)
1000.0000 mL | Freq: Once | INTRAVENOUS | Status: AC
  Administered 2019-05-13: 1000 mL via INTRAVENOUS

## 2019-05-13 NOTE — ED Triage Notes (Signed)
Pt c/o vomiting today and knee pain from falling today and yesterday; pt states he has been drinking vodka for the last 2 days

## 2019-05-13 NOTE — ED Provider Notes (Signed)
[facility] EMERGENCY DEPARTMENT Provider Note   CSN: [id number] Arrival date & time: 05/13/19  2155     History Chief Complaint  Patient presents with  . Emesis    Gabriel Zavala is a 54 y.o. male.  The history is provided by the patient.  Emesis Severity:  Moderate Progression:  Unchanged Chronicity:  New Relieved by:  None tried Worsened by:  Nothing Associated symptoms: arthralgias   Associated symptoms: no abdominal pain, no cough, no fever and no headaches   Patient with history of alcohol abuse, hypertension, previous history of seizures presents with vomiting.  Patient admits to drinking vodka during the day has had nonbloody emesis.  He thinks he only vomited once.  He denies any chest pain or abdominal pain.  No headaches.  He thinks he fell the previous day and did injure both of his knees. He has no other complaints     Past Medical History:  Diagnosis Date  . Gout   . History of nuclear stress test 06/2017   "No evidence of pharmacologically induced myocardial ischemia or prior MI"  . Hypercholesteremia   . Hypertension   . Seizure ([facility]) 10/16/2018    Patient Active Problem List   Diagnosis Date Noted  . Seizure ([facility]) 10/16/2018  . AKI (acute kidney injury) ([facility]) 10/16/2018  . Hypokalemia 10/16/2018  . Elevated bilirubin 10/16/2018  . Leukocytosis 10/16/2018  . TIA (transient ischemic attack) 10/16/2018  . Hyperglycemia 10/16/2018    Past Surgical History:  Procedure Laterality Date  . HERNIA REPAIR         Family History  Problem Relation Age of Onset  . Hypertension Mother   . Hypertension Sister   . Hypertension Brother   . Seizures Brother        Secondary to TBI  . Hypertension Sister     Social History   Tobacco Use  . Smoking status: Never Smoker  . Smokeless tobacco: Never Used  Substance Use Topics  . Alcohol use: Yes    Comment: occ  . Drug use: Not Currently    Home Medications Prior to Admission medications    Medication Sig Start Date End Date Taking? Authorizing Provider  allopurinol (ZYLOPRIM) 300 MG tablet Take 300 mg by mouth daily.     [provider]  aspirin EC 81 MG tablet Take 81 mg by mouth daily.    [provider]  cloNIDine (CATAPRES) 0.1 MG tablet Take 1 tablet (0.1 mg total) by mouth 2 (two) times daily. 04/30/19   Marinus Maw, MD  fenofibrate (TRICOR) 145 MG tablet Take 145 mg by mouth once a week.     [provider]  MESTINON 60 MG tablet Take 30 mg by mouth 3 (three) times daily. 05/06/19   [provider]  metoprolol succinate (TOPROL-XL) 50 MG 24 hr tablet Take 1 tablet (50 mg total) by mouth daily. Take with or immediately following a meal. 04/30/19 07/29/19  Marinus Maw, MD  Multiple Vitamin (MULTIVITAMIN WITH MINERALS) TABS tablet Take 1 tablet by mouth daily.    [provider]  traZODone (DESYREL) 100 MG tablet Take 100 mg by mouth at bedtime. 12/20/18   [provider]    Allergies    Patient has no known allergies.  Review of Systems   Review of Systems  Constitutional: Negative for fever.  Respiratory: Negative for cough.   Cardiovascular: Negative for chest pain.  Gastrointestinal: Positive for vomiting. Negative for abdominal pain.  Musculoskeletal:  Positive for arthralgias.  Neurological: Negative for headaches.  All other systems reviewed and are negative.   Physical Exam Updated Vital Signs BP (!) 151/132 (BP Location: Right Arm)   Pulse (!) 144   Temp 98.5 F (36.9 C) (Oral)   Resp 16   Ht 1.74 m (5' 8.5")   Wt 91.2 kg   SpO2 97%   BMI 30.13 kg/m   Physical Exam CONSTITUTIONAL: Disheveled, no acute distress HEAD: Normocephalic/atraumatic EYES: EOMI/PERRL, no icterus ENMT: Mucous membranes dry NECK: supple no meningeal signs SPINE/BACK:entire spine nontender CV: S1/S2 noted, tachycardic LUNGS: Lungs are clear to auscultation bilaterally, no apparent distress ABDOMEN: soft, nontender,  no rebound or guarding, bowel sounds noted throughout abdomen GU:no cva tenderness NEURO: Pt is awake/alert/appropriate, moves all extremitiesx4.  No facial droop.  No arm or leg drift.  No facial droop No tremor EXTREMITIES: pulses normal/equal, full ROM, healing abrasions to bilateral knees.  No deformities, full range of motion of both knees.  Minimal tenderness to palpation All other extremities/joints palpated/ranged and nontender SKIN: warm, color normal PSYCH: no abnormalities of mood noted, alert and oriented to situation ED Results / Procedures / Treatments   Labs (all labs ordered are listed, but only abnormal results are displayed) Labs Reviewed  COMPREHENSIVE METABOLIC PANEL - Abnormal; Notable for the following components:      Result Value   CO2 16 (*)    Glucose, Bld 155 (*)    Creatinine, Ser 1.42 (*)    AST 64 (*)    Total Bilirubin 1.5 (*)    GFR calc non Af Amer 55 (*)    Anion gap 21 (*)    All other components within normal limits  CBC - Abnormal; Notable for the following components:   WBC 10.6 (*)    RDW 15.7 (*)    All other components within normal limits  ETHANOL - Abnormal; Notable for the following components:   Alcohol, Ethyl (B) 131 (*)    All other components within normal limits  URINALYSIS, ROUTINE W REFLEX MICROSCOPIC - Abnormal; Notable for the following components:   Color, Urine AMBER (*)    APPearance HAZY (*)    Hgb urine dipstick SMALL (*)    Protein, ur 30 (*)    All other components within normal limits  SALICYLATE LEVEL - Abnormal; Notable for the following components:   Salicylate Lvl <7.0 (*)    All other components within normal limits  LACTIC ACID, PLASMA - Abnormal; Notable for the following components:   Lactic Acid, Venous 7.7 (*)    All other components within normal limits  ACETAMINOPHEN LEVEL - Abnormal; Notable for the following components:   Acetaminophen (Tylenol), Serum <10 (*)    All other components within normal  limits  LIPASE, BLOOD  RAPID URINE DRUG SCREEN, HOSP PERFORMED  CK  LACTIC ACID, PLASMA  VOLATILES,BLD-ACETONE,ETHANOL,ISOPROP,METHANOL  OSMOLALITY  BLOOD GAS, ARTERIAL    EKG EKG Interpretation  Date/Time:  Tuesday May 13 2019 23:53:58 EST Ventricular Rate:  129 PR Interval:    QRS Duration: 83 QT Interval:  327 QTC Calculation: 479 R Axis:   -20 Text Interpretation: Sinus tachycardia Ventricular premature complex Borderline left axis deviation Borderline prolonged QT interval rate is slower when compared to prior Confirmed by Zadie Rhine ([id number]) on 05/14/2019 1:09:27 AM   Radiology DG Chest Port 1 View  Result Date: 05/13/2019 CLINICAL DATA:  Vomiting EXAM: PORTABLE CHEST 1 VIEW COMPARISON:  03/25/2019 FINDINGS: The heart size and mediastinal contours are  within normal limits. Both lungs are clear. The visualized skeletal structures are unremarkable. IMPRESSION: No active disease. Electronically Signed   By: Jasmine Pang M.D.   On: 05/13/2019 23:44    Procedures .Critical Care Performed by: Zadie Rhine, MD Authorized by: Zadie Rhine, MD   Critical care provider statement:    Critical care time (minutes):  60   Critical care start time:  05/14/2019 12:20 AM   Critical care end time:  05/14/2019 1:20 AM   Critical care time was exclusive of:  Separately billable procedures and treating other patients   Critical care was necessary to treat or prevent imminent or life-threatening deterioration of the following conditions:  Dehydration and metabolic crisis   Critical care was time spent personally by me on the following activities:  Evaluation of patient's response to treatment, examination of patient, re-evaluation of patient's condition, pulse oximetry, ordering and review of laboratory studies, ordering and review of radiographic studies, ordering and performing treatments and interventions and review of old charts   I assumed direction of critical care for  this patient from another provider in my specialty: no       Medications Ordered in ED Medications  sodium chloride 0.9 % bolus 1,000 mL (0 mLs Intravenous Stopped 05/14/19 0053)  ondansetron (ZOFRAN) injection 4 mg (4 mg Intravenous Given 05/13/19 2359)  sodium chloride 0.9 % bolus 1,000 mL (0 mLs Intravenous Stopped 05/14/19 0140)  lactated ringers bolus 1,000 mL (1,000 mLs Intravenous New Bag/Given 05/14/19 0140)    ED Course  I have reviewed the triage vital signs and the nursing notes.  Pertinent labs & imaging results that were available during my care of the patient were reviewed by me and considered in my medical decision making (see chart for details).    MDM Rules/Calculators/A&P                      11:51 PM Patient presents after reportedly drinking alcohol and having vomiting. Patient is tachycardic and does appear dehydrated   Has a history of seizures in the past, but is no longer medications. Also of note has been seen by cardiology recently for autonomic dysfunction causing syncope Patient is also noted to have inappropriate sinus tachycardia on previous cardiology visit Patient is currently undergoing evaluation by electrophysiology. 12:23 AM Patient found to have dehydration as well as anion gap Patient reports his ongoing drinking store purchased vodka, no homemade alcohols.  He denies any intentional overdose or abuse of aspirin or Goody powder. He denies any other drug abuse. He does admit to heavy vodka use over the past 24 hours His mental status is appropriate He will be given further IV fluids. 1:10 AM Patient overall is improving. He does not think he took his medications over the past day.  This includes Toprol as well as clonidine.  He is to be given the patient the ER EKG reveals sinus tachycardia similar to prior Labs pending at this time 1:43 AM Will hold on oral medications for now due to nausea and vomiting  Patient has a significantly  elevated lactate greater than 7.  However he is afebrile.  He has no abdominal pain and no focal abdominal tenderness.  Patient is awake and alert with normal mental status and no respiratory distress He is awake and alert at this time.  He had does appear to be improving. Unclear cause of elevated lactate, but I have low suspicion for sepsis or intestinal ischemia He continues to  deny any overdose at this time. He will be given more IV fluids and will be admitted to the hospital.  We will also add on evaluation for osmolality/volatiles/ABG  Plan will be to recheck lactate and electrolytes after IV fluids Patient reports he basically only drink vodka for the past 24 hours 2:10 AM I was able to make contact with his daughter [place] via phone.  Patient gave me permission for this. She reports that he does drink alcohol fairly heavily. She also reports that he is recently been called family saying he is depressed.  However he has not expressed any suicidal intent. She is not aware of him attempting to overdose.  Due to his recent report of depression as well as significant lab abnormalities, toxic alcohol overdose is now higher on the differential. I discussed the case with the [place] control center we reviewed the labs. At this point, they recommend continuing with IV hydration and monitoring urine output They  do not recommend fomepizole at this time. Recheck electrolytes and lactate after fourth liter of IV fluid 2:42 AM Consulted critical care Dr. Lucile Shutters He recommends thiamine, IV fluids, admit to medicine Discussed with Dr. Olevia Bowens with medicine.  He will recheck lactate and admit.  He also recommends Lopressor.  Final Clinical Impression(s) / ED Diagnoses Final diagnoses:  Dehydration  Non-intractable vomiting with nausea, unspecified vomiting type  Lactic acidosis  Alcohol abuse    Rx / DC Orders ED Discharge Orders    None       Ripley Fraise, MD 05/14/19 [phone]

## 2019-05-14 ENCOUNTER — Ambulatory Visit: Payer: BC Managed Care – PPO

## 2019-05-14 ENCOUNTER — Inpatient Hospital Stay (HOSPITAL_COMMUNITY): Payer: BC Managed Care – PPO

## 2019-05-14 DIAGNOSIS — E7800 Pure hypercholesterolemia, unspecified: Secondary | ICD-10-CM | POA: Diagnosis present

## 2019-05-14 DIAGNOSIS — Z789 Other specified health status: Secondary | ICD-10-CM

## 2019-05-14 DIAGNOSIS — Z8249 Family history of ischemic heart disease and other diseases of the circulatory system: Secondary | ICD-10-CM | POA: Diagnosis not present

## 2019-05-14 DIAGNOSIS — E872 Acidosis, unspecified: Secondary | ICD-10-CM | POA: Diagnosis present

## 2019-05-14 DIAGNOSIS — K709 Alcoholic liver disease, unspecified: Secondary | ICD-10-CM | POA: Diagnosis present

## 2019-05-14 DIAGNOSIS — M109 Gout, unspecified: Secondary | ICD-10-CM | POA: Diagnosis present

## 2019-05-14 DIAGNOSIS — F10239 Alcohol dependence with withdrawal, unspecified: Secondary | ICD-10-CM | POA: Diagnosis present

## 2019-05-14 DIAGNOSIS — R7302 Impaired glucose tolerance (oral): Secondary | ICD-10-CM

## 2019-05-14 DIAGNOSIS — F10230 Alcohol dependence with withdrawal, uncomplicated: Secondary | ICD-10-CM | POA: Diagnosis not present

## 2019-05-14 DIAGNOSIS — F1010 Alcohol abuse, uncomplicated: Secondary | ICD-10-CM | POA: Insufficient documentation

## 2019-05-14 DIAGNOSIS — Z79899 Other long term (current) drug therapy: Secondary | ICD-10-CM | POA: Diagnosis not present

## 2019-05-14 DIAGNOSIS — G909 Disorder of the autonomic nervous system, unspecified: Secondary | ICD-10-CM | POA: Diagnosis present

## 2019-05-14 DIAGNOSIS — Z7982 Long term (current) use of aspirin: Secondary | ICD-10-CM | POA: Diagnosis not present

## 2019-05-14 DIAGNOSIS — R739 Hyperglycemia, unspecified: Secondary | ICD-10-CM | POA: Diagnosis present

## 2019-05-14 DIAGNOSIS — Z20822 Contact with and (suspected) exposure to covid-19: Secondary | ICD-10-CM | POA: Diagnosis present

## 2019-05-14 DIAGNOSIS — M25561 Pain in right knee: Secondary | ICD-10-CM | POA: Diagnosis not present

## 2019-05-14 DIAGNOSIS — E860 Dehydration: Secondary | ICD-10-CM | POA: Insufficient documentation

## 2019-05-14 DIAGNOSIS — K2920 Alcoholic gastritis without bleeding: Secondary | ICD-10-CM | POA: Diagnosis present

## 2019-05-14 DIAGNOSIS — I160 Hypertensive urgency: Secondary | ICD-10-CM | POA: Diagnosis present

## 2019-05-14 DIAGNOSIS — I10 Essential (primary) hypertension: Secondary | ICD-10-CM | POA: Diagnosis present

## 2019-05-14 DIAGNOSIS — R569 Unspecified convulsions: Secondary | ICD-10-CM | POA: Diagnosis present

## 2019-05-14 DIAGNOSIS — F10229 Alcohol dependence with intoxication, unspecified: Secondary | ICD-10-CM | POA: Diagnosis present

## 2019-05-14 DIAGNOSIS — W010XXA Fall on same level from slipping, tripping and stumbling without subsequent striking against object, initial encounter: Secondary | ICD-10-CM | POA: Diagnosis present

## 2019-05-14 DIAGNOSIS — F1090 Alcohol use, unspecified, uncomplicated: Secondary | ICD-10-CM

## 2019-05-14 DIAGNOSIS — Z7289 Other problems related to lifestyle: Secondary | ICD-10-CM

## 2019-05-14 DIAGNOSIS — Y906 Blood alcohol level of 120-199 mg/100 ml: Secondary | ICD-10-CM | POA: Diagnosis present

## 2019-05-14 LAB — CBC
HCT: 38.3 % — ABNORMAL LOW (ref 39.0–52.0)
Hemoglobin: 12.4 g/dL — ABNORMAL LOW (ref 13.0–17.0)
MCH: 30.2 pg (ref 26.0–34.0)
MCHC: 32.4 g/dL (ref 30.0–36.0)
MCV: 93.2 fL (ref 80.0–100.0)
Platelets: 274 10*3/uL (ref 150–400)
RBC: 4.11 MIL/uL — ABNORMAL LOW (ref 4.22–5.81)
RDW: 15.3 % (ref 11.5–15.5)
WBC: 9.1 10*3/uL (ref 4.0–10.5)
nRBC: 0 % (ref 0.0–0.2)

## 2019-05-14 LAB — URINALYSIS, ROUTINE W REFLEX MICROSCOPIC
Bacteria, UA: NONE SEEN
Bilirubin Urine: NEGATIVE
Glucose, UA: NEGATIVE mg/dL
Ketones, ur: NEGATIVE mg/dL
Leukocytes,Ua: NEGATIVE
Nitrite: NEGATIVE
Protein, ur: 30 mg/dL — AB
Specific Gravity, Urine: 1.014 (ref 1.005–1.030)
pH: 5 (ref 5.0–8.0)

## 2019-05-14 LAB — BLOOD GAS, VENOUS
Acid-Base Excess: 0.7 mmol/L (ref 0.0–2.0)
Acid-base deficit: 2.4 mmol/L — ABNORMAL HIGH (ref 0.0–2.0)
Bicarbonate: 23.2 mmol/L (ref 20.0–28.0)
Bicarbonate: 25.9 mmol/L (ref 20.0–28.0)
FIO2: 100
FIO2: 21
O2 Saturation: 97.9 %
O2 Saturation: 96 %
Patient temperature: 37.2
Patient temperature: 37.2
pCO2, Ven: 29 mmHg — ABNORMAL LOW (ref 44.0–60.0)
pCO2, Ven: 28.9 mmHg — ABNORMAL LOW (ref 44.0–60.0)
pH, Ven: 7.469 — ABNORMAL HIGH (ref 7.250–7.430)
pH, Ven: 7.519 — ABNORMAL HIGH (ref 7.250–7.430)
pO2, Ven: 108 mmHg — ABNORMAL HIGH (ref 32.0–45.0)
pO2, Ven: 78.7 mmHg — ABNORMAL HIGH (ref 32.0–45.0)

## 2019-05-14 LAB — VOLATILES,BLD-ACETONE,ETHANOL,ISOPROP,METHANOL
Acetone, blood: <0.01 g/dL (ref 0.000–0.010)
Ethanol, blood: 0.037 g/dL — ABNORMAL HIGH (ref 0.000–0.010)
Isopropanol, blood: <0.01 g/dL (ref 0.000–0.010)
Methanol, blood: <0.01 g/dL (ref 0.000–0.010)

## 2019-05-14 LAB — LACTIC ACID, PLASMA
Lactic Acid, Venous: 7.7 mmol/L (ref 0.5–1.9)
Lactic Acid, Venous: 8 mmol/L (ref 0.5–1.9)
Lactic Acid, Venous: 6.1 mmol/L (ref 0.5–1.9)
Lactic Acid, Venous: 5.1 mmol/L (ref 0.5–1.9)
Lactic Acid, Venous: 5.2 mmol/L (ref 0.5–1.9)

## 2019-05-14 LAB — COMPREHENSIVE METABOLIC PANEL
ALT: 31 U/L (ref 0–44)
ALT: 25 U/L (ref 0–44)
ALT: 25 U/L (ref 0–44)
AST: 64 U/L — ABNORMAL HIGH (ref 15–41)
AST: 48 U/L — ABNORMAL HIGH (ref 15–41)
AST: 50 U/L — ABNORMAL HIGH (ref 15–41)
Albumin: 4 g/dL (ref 3.5–5.0)
Albumin: 3.2 g/dL — ABNORMAL LOW (ref 3.5–5.0)
Albumin: 3.3 g/dL — ABNORMAL LOW (ref 3.5–5.0)
Alkaline Phosphatase: 125 U/L (ref 38–126)
Alkaline Phosphatase: 97 U/L (ref 38–126)
Alkaline Phosphatase: 97 U/L (ref 38–126)
Anion gap: 21 — ABNORMAL HIGH (ref 5–15)
Anion gap: 16 — ABNORMAL HIGH (ref 5–15)
Anion gap: 15 (ref 5–15)
BUN: 7 mg/dL (ref 6–20)
BUN: 5 mg/dL — ABNORMAL LOW (ref 6–20)
BUN: 5 mg/dL — ABNORMAL LOW (ref 6–20)
CO2: 16 mmol/L — ABNORMAL LOW (ref 22–32)
CO2: 20 mmol/L — ABNORMAL LOW (ref 22–32)
CO2: 23 mmol/L (ref 22–32)
Calcium: 8.9 mg/dL (ref 8.9–10.3)
Calcium: 8.2 mg/dL — ABNORMAL LOW (ref 8.9–10.3)
Calcium: 8.1 mg/dL — ABNORMAL LOW (ref 8.9–10.3)
Chloride: 106 mmol/L (ref 98–111)
Chloride: 106 mmol/L (ref 98–111)
Chloride: 101 mmol/L (ref 98–111)
Creatinine, Ser: 1.42 mg/dL — ABNORMAL HIGH (ref 0.61–1.24)
Creatinine, Ser: 1.06 mg/dL (ref 0.61–1.24)
Creatinine, Ser: 1.16 mg/dL (ref 0.61–1.24)
GFR calc Af Amer: >60 mL/min (ref >60)
GFR calc Af Amer: >60 mL/min (ref >60)
GFR calc Af Amer: >60 mL/min (ref >60)
GFR calc non Af Amer: 55 mL/min — ABNORMAL LOW (ref >60)
GFR calc non Af Amer: >60 mL/min (ref >60)
GFR calc non Af Amer: >60 mL/min (ref >60)
Glucose, Bld: 155 mg/dL — ABNORMAL HIGH (ref 70–99)
Glucose, Bld: 110 mg/dL — ABNORMAL HIGH (ref 70–99)
Glucose, Bld: 105 mg/dL — ABNORMAL HIGH (ref 70–99)
Potassium: 3.5 mmol/L (ref 3.5–5.1)
Potassium: 3.8 mmol/L (ref 3.5–5.1)
Potassium: 3.6 mmol/L (ref 3.5–5.1)
Sodium: 143 mmol/L (ref 135–145)
Sodium: 142 mmol/L (ref 135–145)
Sodium: 139 mmol/L (ref 135–145)
Total Bilirubin: 1.5 mg/dL — ABNORMAL HIGH (ref 0.3–1.2)
Total Bilirubin: 1.6 mg/dL — ABNORMAL HIGH (ref 0.3–1.2)
Total Bilirubin: 2.3 mg/dL — ABNORMAL HIGH (ref 0.3–1.2)
Total Protein: 8.1 g/dL (ref 6.5–8.1)
Total Protein: 6.7 g/dL (ref 6.5–8.1)
Total Protein: 6.8 g/dL (ref 6.5–8.1)

## 2019-05-14 LAB — BLOOD GAS, ARTERIAL
Acid-base deficit: 7.8 mmol/L — ABNORMAL HIGH (ref 0.0–2.0)
Bicarbonate: 18.7 mmol/L — ABNORMAL LOW (ref 20.0–28.0)
Drawn by: 41977
FIO2: 21
O2 Saturation: 95.2 %
Patient temperature: 37
pCO2 arterial: 27.5 mmHg — ABNORMAL LOW (ref 32.0–48.0)
pH, Arterial: 7.389 (ref 7.350–7.450)
pO2, Arterial: 88.6 mmHg (ref 83.0–108.0)

## 2019-05-14 LAB — RAPID URINE DRUG SCREEN, HOSP PERFORMED
Amphetamines: NOT DETECTED
Barbiturates: NOT DETECTED
Benzodiazepines: NOT DETECTED
Cocaine: NOT DETECTED
Opiates: NOT DETECTED
Tetrahydrocannabinol: NOT DETECTED

## 2019-05-14 LAB — RESPIRATORY PANEL BY RT PCR (FLU A&B, COVID)
Influenza A by PCR: NEGATIVE
Influenza B by PCR: NEGATIVE
SARS Coronavirus 2 by RT PCR: NEGATIVE

## 2019-05-14 LAB — PHOSPHORUS: Phosphorus: 4.3 mg/dL (ref 2.5–4.6)

## 2019-05-14 LAB — ACETAMINOPHEN LEVEL: Acetaminophen (Tylenol), Serum: <10 ug/mL — ABNORMAL LOW (ref 10–30)

## 2019-05-14 LAB — MAGNESIUM: Magnesium: 1.5 mg/dL — ABNORMAL LOW (ref 1.7–2.4)

## 2019-05-14 LAB — LIPASE, BLOOD: Lipase: 50 U/L (ref 11–51)

## 2019-05-14 LAB — OSMOLALITY: Osmolality: 335 mOsm/kg (ref 275–295)

## 2019-05-14 LAB — CK: Total CK: 266 U/L (ref 49–397)

## 2019-05-14 LAB — MRSA PCR SCREENING: MRSA by PCR: NEGATIVE

## 2019-05-14 LAB — GLUCOSE, CAPILLARY
Glucose-Capillary: 100 mg/dL — ABNORMAL HIGH (ref 70–99)
Glucose-Capillary: 113 mg/dL — ABNORMAL HIGH (ref 70–99)
Glucose-Capillary: 92 mg/dL (ref 70–99)

## 2019-05-14 LAB — SALICYLATE LEVEL: Salicylate Lvl: <7 mg/dL — ABNORMAL LOW (ref 7.0–30.0)

## 2019-05-14 MED ORDER — METOPROLOL TARTRATE 5 MG/5ML IV SOLN
5.0000 mg | Freq: Four times a day (QID) | INTRAVENOUS | Status: DC
  Administered 2019-05-14 – 2019-05-15 (×5): 5 mg via INTRAVENOUS
  Filled 2019-05-14 (×5): qty 5

## 2019-05-14 MED ORDER — PROCHLORPERAZINE EDISYLATE 10 MG/2ML IJ SOLN: 5.0000 mg | INTRAMUSCULAR | Status: DC | PRN

## 2019-05-14 MED ORDER — ENOXAPARIN SODIUM 40 MG/0.4ML ~~LOC~~ SOLN
40.0000 mg | SUBCUTANEOUS | Status: DC
  Administered 2019-05-14 – 2019-05-17 (×4): 40 mg via SUBCUTANEOUS
  Filled 2019-05-14 (×3): qty 0.4

## 2019-05-14 MED ORDER — ONDANSETRON HCL 4 MG/2ML IJ SOLN
4.0000 mg | Freq: Four times a day (QID) | INTRAMUSCULAR | Status: DC
  Administered 2019-05-14 – 2019-05-15 (×4): 4 mg via INTRAVENOUS
  Filled 2019-05-14 (×4): qty 2

## 2019-05-14 MED ORDER — STERILE WATER FOR INJECTION IV SOLN
INTRAVENOUS | Status: DC
  Filled 2019-05-14 (×2): qty 850

## 2019-05-14 MED ORDER — CLONIDINE HCL 0.1 MG PO TABS
0.1000 mg | ORAL_TABLET | Freq: Two times a day (BID) | ORAL | Status: DC
  Administered 2019-05-14 – 2019-05-17 (×7): 0.1 mg via ORAL
  Filled 2019-05-14 (×8): qty 1

## 2019-05-14 MED ORDER — LACTATED RINGERS IV BOLUS
1000.0000 mL | Freq: Once | INTRAVENOUS | Status: AC
  Administered 2019-05-14: 1000 mL via INTRAVENOUS

## 2019-05-14 MED ORDER — LORAZEPAM 1 MG PO TABS
1.0000 mg | ORAL_TABLET | ORAL | Status: AC | PRN
  Administered 2019-05-17: 2 mg via ORAL
  Filled 2019-05-14: qty 2

## 2019-05-14 MED ORDER — SODIUM BICARBONATE-DEXTROSE 150-5 MEQ/L-% IV SOLN
150.0000 meq | INTRAVENOUS | Status: DC
  Administered 2019-05-14: 150 meq via INTRAVENOUS
  Filled 2019-05-14 (×2): qty 1000

## 2019-05-14 MED ORDER — SODIUM CHLORIDE 0.45 % IV SOLN: INTRAVENOUS | Status: DC

## 2019-05-14 MED ORDER — HYDRALAZINE HCL 20 MG/ML IJ SOLN
20.0000 mg | Freq: Once | INTRAMUSCULAR | Status: AC
  Administered 2019-05-14: 20 mg via INTRAVENOUS
  Filled 2019-05-14: qty 1

## 2019-05-14 MED ORDER — THIAMINE HCL 100 MG/ML IJ SOLN
100.0000 mg | Freq: Every day | INTRAMUSCULAR | Status: DC
  Filled 2019-05-14: qty 2

## 2019-05-14 MED ORDER — PANTOPRAZOLE SODIUM 40 MG IV SOLR
40.0000 mg | Freq: Two times a day (BID) | INTRAVENOUS | Status: DC
  Administered 2019-05-14 – 2019-05-17 (×7): 40 mg via INTRAVENOUS
  Filled 2019-05-14 (×7): qty 40

## 2019-05-14 MED ORDER — ACETAMINOPHEN 325 MG PO TABS
650.0000 mg | ORAL_TABLET | Freq: Four times a day (QID) | ORAL | Status: DC | PRN
  Administered 2019-05-14 – 2019-05-15 (×3): 650 mg via ORAL
  Filled 2019-05-14 (×4): qty 2

## 2019-05-14 MED ORDER — LIDOCAINE 5 % EX PTCH
2.0000 | MEDICATED_PATCH | CUTANEOUS | Status: DC
  Administered 2019-05-14 – 2019-05-17 (×4): 2 via TRANSDERMAL
  Filled 2019-05-14 (×7): qty 2

## 2019-05-14 MED ORDER — FOLIC ACID 1 MG PO TABS
1.0000 mg | ORAL_TABLET | Freq: Every day | ORAL | Status: DC
  Administered 2019-05-14 – 2019-05-17 (×4): 1 mg via ORAL
  Filled 2019-05-14 (×5): qty 1

## 2019-05-14 MED ORDER — SODIUM BICARBONATE-DEXTROSE 150-5 MEQ/L-% IV SOLN
150.0000 meq | INTRAVENOUS | Status: DC
  Administered 2019-05-14: 150 meq via INTRAVENOUS
  Filled 2019-05-14 (×3): qty 1000

## 2019-05-14 MED ORDER — METOPROLOL TARTRATE 5 MG/5ML IV SOLN
5.0000 mg | Freq: Once | INTRAVENOUS | Status: AC
  Administered 2019-05-14: 5 mg via INTRAVENOUS
  Filled 2019-05-14: qty 5

## 2019-05-14 MED ORDER — MAGNESIUM SULFATE 2 GM/50ML IV SOLN
2.0000 g | Freq: Once | INTRAVENOUS | Status: AC
  Administered 2019-05-14: 2 g via INTRAVENOUS
  Filled 2019-05-14: qty 50

## 2019-05-14 MED ORDER — SODIUM BICARBONATE 8.4 % IV SOLN
INTRAVENOUS | Status: AC
  Filled 2019-05-14: qty 50

## 2019-05-14 MED ORDER — CHLORHEXIDINE GLUCONATE CLOTH 2 % EX PADS
6.0000 | MEDICATED_PAD | Freq: Every day | CUTANEOUS | Status: DC
  Administered 2019-05-14 – 2019-05-17 (×4): 6 via TOPICAL

## 2019-05-14 MED ORDER — CLONIDINE HCL 0.1 MG PO TABS: 0.1000 mg | ORAL_TABLET | Freq: Once | ORAL | Status: DC

## 2019-05-14 MED ORDER — SODIUM CHLORIDE 0.9 % IV BOLUS (SEPSIS)
1000.0000 mL | Freq: Once | INTRAVENOUS | Status: AC
  Administered 2019-05-14: 1000 mL via INTRAVENOUS

## 2019-05-14 MED ORDER — THIAMINE HCL 100 MG PO TABS
100.0000 mg | ORAL_TABLET | Freq: Every day | ORAL | Status: DC
  Administered 2019-05-15 – 2019-05-16 (×2): 100 mg via ORAL
  Filled 2019-05-14 (×2): qty 1

## 2019-05-14 MED ORDER — METOPROLOL TARTRATE 50 MG PO TABS: 50.0000 mg | ORAL_TABLET | Freq: Once | ORAL | Status: DC

## 2019-05-14 MED ORDER — SODIUM BICARBONATE 8.4 % IV SOLN
50.0000 meq | Freq: Once | INTRAVENOUS | Status: AC
  Administered 2019-05-14: 50 meq via INTRAVENOUS

## 2019-05-14 MED ORDER — POTASSIUM CHLORIDE IN NACL 20-0.9 MEQ/L-% IV SOLN: INTRAVENOUS | Status: DC

## 2019-05-14 MED ORDER — LORAZEPAM 2 MG/ML IJ SOLN
1.0000 mg | INTRAMUSCULAR | Status: AC | PRN
  Administered 2019-05-14: 2 mg via INTRAVENOUS
  Administered 2019-05-14: 1 mg via INTRAVENOUS
  Administered 2019-05-14: 4 mg via INTRAVENOUS
  Administered 2019-05-14: 1 mg via INTRAVENOUS
  Administered 2019-05-14 – 2019-05-15 (×3): 2 mg via INTRAVENOUS
  Administered 2019-05-15 (×2): 1 mg via INTRAVENOUS
  Administered 2019-05-15 – 2019-05-16 (×4): 2 mg via INTRAVENOUS
  Administered 2019-05-16: 4 mg via INTRAVENOUS
  Filled 2019-05-14 (×3): qty 1
  Filled 2019-05-14: qty 2
  Filled 2019-05-14 (×2): qty 1
  Filled 2019-05-14 (×2): qty 2
  Filled 2019-05-14 (×5): qty 1

## 2019-05-14 MED ORDER — ADULT MULTIVITAMIN W/MINERALS CH
1.0000 | ORAL_TABLET | Freq: Every day | ORAL | Status: DC
  Administered 2019-05-14 – 2019-05-17 (×4): 1 via ORAL
  Filled 2019-05-14 (×4): qty 1

## 2019-05-14 MED ORDER — THIAMINE HCL 100 MG/ML IJ SOLN
100.0000 mg | Freq: Once | INTRAMUSCULAR | Status: AC
  Administered 2019-05-14: 100 mg via INTRAVENOUS
  Filled 2019-05-14: qty 2

## 2019-05-14 NOTE — [facility] Counselor (Signed)
Disposition:   Denzil Magnuson, NP, recommend observation overnight at [facility] due to patient's history of seizures and his sister's concern of patient expressing suicidal ideations.

## 2019-05-14 NOTE — Progress Notes (Signed)
Spoke with sister, Travonta Gill, who expressed concerns of pt being suicidal. Sister states there have been multiple occurences of pt stating he wants to harm himself and stating he is going to run out to the road and let a care hit him. Sister states she has called police on him and when the police arrive, pt denies having any thought or plans of harming himself. Assessed pt and asked him he was depressed, had any thoughts of harming himself or others, and if he had an plans to harm himself. To each question pt denied any thought of harming selfs or other and denied any plans to harm self. Dr. Arbutus Leas made aware and order placed for TTS consult.

## 2019-05-14 NOTE — BH Assessment (Signed)
Tele Assessment Note   Patient Name: Gabriel Zavala MRN: [id number] Referring Physician: Catarina Hartshorn, MD  Location of Patient: [facility] Location of Provider: Behavioral Health TTS Department  Gabriel Zavala is an 54 y.o. male with a history of hyperlipidemia, hypertension, seizure disorder, alcohol dependence, depression presenting with nausea, vomiting, and elevated blood pressure.  TTS consult put in as a pre-caution as patient's sister report patient has been making suicidal statements. Patient denied suicidal / homicidal ideations, denied auditory / visual hallucinations and denied paranoia. Patient report drinking a pint or more of alcohol several times per week.   Collateral: Per chart note from Jolly Mango, RN, "Spoke with sister, Gabriel Zavala, who expressed concerns of pt being suicidal. Sister states there have been multiple occurences of pt stating he wants to harm himself and stating he is going to run out to the road and let a care hit him. Sister states she has called police on him and when the police arrive, pt denies having any thought or plans of harming himself. Assessed pt and asked him he was depressed, had any thoughts of harming himself or others, and if he had an plans to harm himself. To each question pt denied any thought of harming selfs or other and denied any plans to harm self. Dr. Arbutus Leas made aware."  Disposition: Denzil Magnuson, NP, recommendation observe overnight   Diagnosis: F33.2  Major depressive disorder, Recurrent episode, Severe   Past Medical History:  Past Medical History:  Diagnosis Date  . Gout   . History of nuclear stress test 06/2017   "No evidence of pharmacologically induced myocardial ischemia or prior MI"  . Hypercholesteremia   . Hypertension   . Seizure (HCC) 10/16/2018    Past Surgical History:  Procedure Laterality Date  . HERNIA REPAIR      Family History:  Family History  Problem Relation Age of Onset  .  Hypertension Mother   . Hypertension Sister   . Hypertension Brother   . Seizures Brother        Secondary to TBI  . Hypertension Sister     Social History:  reports that he has never smoked. He has never used smokeless tobacco. He reports current alcohol use. He reports previous drug use.  Additional Social History:  Alcohol / Drug Use Pain Medications: see MAR Prescriptions: see MAR Over the Counter: see MAR History of alcohol / drug use?: Yes Substance #1 Name of Substance 1: Alcohol 1 - Age of First Use: 17 1 - Amount (size/oz): 1/2 pint or more 1 - Frequency: several times during the week 1 - Duration: ongoing 1 - Last Use / Amount: 05/12/2019  CIWA: CIWA-Ar BP: (!) 195/116 Pulse Rate: 90 Nausea and Vomiting: no nausea and no vomiting Tactile Disturbances: very mild itching, pins and needles, burning or numbness Tremor: two Auditory Disturbances: very mild harshness or ability to frighten Paroxysmal Sweats: barely perceptible sweating, palms moist Visual Disturbances: very mild sensitivity Anxiety: two Headache, Fullness in Head: very mild Agitation: two Orientation and Clouding of Sensorium: oriented and can do serial additions CIWA-Ar Total: 11 COWS:    Allergies: No Known Allergies  Home Medications:  Medications Prior to Admission  Medication Sig Dispense Refill  . allopurinol (ZYLOPRIM) 300 MG tablet Take 300 mg by mouth daily.     Marland Kitchen aspirin EC 81 MG tablet Take 81 mg by mouth daily.    . cloNIDine (CATAPRES) 0.1 MG tablet Take 1 tablet (0.1 mg total) by mouth 2 (  two) times daily. 60 tablet 11  . fenofibrate (TRICOR) 145 MG tablet Take 145 mg by mouth once a week.     . MESTINON 60 MG tablet Take 30 mg by mouth 3 (three) times daily.    . metoprolol succinate (TOPROL-XL) 50 MG 24 hr tablet Take 1 tablet (50 mg total) by mouth daily. Take with or immediately following a meal. 90 tablet 3  . Multiple Vitamin (MULTIVITAMIN WITH MINERALS) TABS tablet Take 1  tablet by mouth daily.    . traZODone (DESYREL) 100 MG tablet Take 100 mg by mouth at bedtime.      OB/GYN Status:  No LMP for male patient.  General Assessment Data Location of Assessment: [facility] TTS Assessment: In system Is this a Tele or Face-to-Face Assessment?: Face-to-Face Is this an Initial Assessment or a Re-assessment for this encounter?: Initial Assessment Patient Accompanied by:: N/A Language Other than English: No Living Arrangements: (alone ) What gender do you identify as?: Male Living Arrangements: Alone Can pt return to current living arrangement?: Yes Admission Status: Voluntary Is patient capable of signing voluntary admission?: Yes Referral Source: Self/Family/Friend Insurance type: Engineer, materials PPO      Crisis Care Plan Living Arrangements: Alone Name of Psychiatrist: none report  Name of Therapist: none report   Education Status Is patient currently in school?: No  Risk to self with the past 6 months Suicidal Ideation: No(patient denies) Has patient been a risk to self within the past 6 months prior to admission? : No Suicidal Intent: No Has patient had any suicidal intent within the past 6 months prior to admission? : No Is patient at risk for suicide?: No Suicidal Plan?: No Has patient had any suicidal plan within the past 6 months prior to admission? : No Access to Means: No What has been your use of drugs/alcohol within the last 12 months?: Alcohol  Previous Attempts/Gestures: No How many times?: 0 Other Self Harm Risks: none report  Triggers for Past Attempts: None known Intentional Self Injurious Behavior: None Family Suicide History: Unknown Recent stressful life event(s): Other (Comment)(none report ) Persecutory voices/beliefs?: No Depression: No Depression Symptoms: (patient did not report ) Substance abuse history and/or treatment for substance abuse?: No Suicide prevention information given to non-admitted patients: Not  applicable  Risk to Others within the past 6 months Homicidal Ideation: No Does patient have any lifetime risk of violence toward others beyond the six months prior to admission? : No Thoughts of Harm to Others: No Current Homicidal Intent: No Current Homicidal Plan: No Access to Homicidal Means: No Identified Victim: n/a History of harm to others?: No Assessment of Violence: None Noted Violent Behavior Description: none noted  Does patient have access to weapons?: No Criminal Charges Pending?: No Does patient have a court date: No Is patient on probation?: No  Psychosis Hallucinations: None noted(patient denied ) Delusions: None noted(patient denied )  Mental Status Report Appearance/Hygiene: In scrubs Eye Contact: Poor Motor Activity: Freedom of movement Speech: Logical/coherent Level of Consciousness: Alert Mood: Pleasant Affect: Appropriate to circumstance Anxiety Level: None Judgement: Unimpaired Orientation: Person, Place, Time, Situation Obsessive Compulsive Thoughts/Behaviors: None  Cognitive Functioning Concentration: Good Memory: Recent Intact, Remote Intact Is patient IDD: No Insight: Good Impulse Control: Good Appetite: Good Have you had any weight changes? : No Change Sleep: No Change Vegetative Symptoms: None  ADLScreening [facility] Assessment Services) Patient's cognitive ability adequate to safely complete daily activities?: Yes Patient able to express need for assistance with ADLs?: Yes Independently  performs ADLs?: Yes (appropriate for developmental age)  Prior Inpatient Therapy Prior Inpatient Therapy: No  Prior Outpatient Therapy Prior Outpatient Therapy: No Does patient have an ACCT team?: No Does patient have Intensive In-House Services?  : No Does patient have [facility] services? : No Does patient have P4CC services?: No  ADL Screening (condition at time of admission) Patient's cognitive ability adequate to safely complete daily  activities?: Yes Is the patient deaf or have difficulty hearing?: No Does the patient have difficulty seeing, even when wearing glasses/contacts?: No Does the patient have difficulty concentrating, remembering, or making decisions?: No Patient able to express need for assistance with ADLs?: Yes Does the patient have difficulty dressing or bathing?: No Independently performs ADLs?: Yes (appropriate for developmental age) Does the patient have difficulty walking or climbing stairs?: No Weakness of Legs: Both Weakness of Arms/Hands: Both  Home Assistive Devices/Equipment Home Assistive Devices/Equipment: None  Therapy Consults (therapy consults require a physician order) PT Evaluation Needed: No OT Evalulation Needed: No SLP Evaluation Needed: No Abuse/Neglect Assessment (Assessment to be complete while patient is alone) Abuse/Neglect Assessment Can Be Completed: Yes Physical Abuse: Denies Verbal Abuse: Denies Sexual Abuse: Denies Exploitation of patient/patient's resources: Denies Self-Neglect: Denies Values / Beliefs Cultural Requests During Hospitalization: None Spiritual Requests During Hospitalization: None Consults Spiritual Care Consult Needed: No Transition of Care Team Consult Needed: No Advance Directives (For Healthcare) Does Patient Have a Medical Advance Directive?: No Would patient like information on creating a medical advance directive?: No - Patient declined Nutrition Screen- [facility] Adult/[facility] Patient's home diet: Regular Has the patient recently lost weight without trying?: No Has the patient been eating poorly because of a decreased appetite?: No Malnutrition Screening Tool Score: 0        Disposition:  Disposition Initial Assessment Completed for this Encounter: Candy Sledge, NP, observe overnight)     Azarie Coriz [facility] 05/14/2019 11:46 AM

## 2019-05-14 NOTE — Progress Notes (Signed)
PROGRESS NOTE  Gabriel Zavala LPF:[id number] DOB: 05/18/1965 DOA: 05/13/2019 PCP: Ignatius Specking, MD  Brief History:  54 year old male with a history of hyperlipidemia, hypertension, seizure disorder, alcohol dependence, depression presenting with nausea, vomiting, and elevated blood pressure.  The patient stated that he checked his blood pressure at home with an automated cuff that he bought.  He does not remember the actual blood pressure but stated that it was reading high.  He stated that he has had two episodes of emesis without any blood.  He denies any fevers, chills, chest pain, shortness breath, palpitations, syncope, abdominal pain, dysuria, hematuria.  There is no hematochezia or melena.  He denies any loss of consciousness.  He states that he has been drinking 1/2 pint of vodka approximately four times per week.  He last had alcohol on 05/13/2019.  He stated that he fell onto his knees when his legs slipped while getting out of bed on 05/13/2019. He denies any syncope. In the emergency department, the patient was afebrile and hemodynamically stable with oxygen saturation 100% room air.  In fact patient was hypertensive with blood pressure of 229/126.  He was tachycardic in the 140s.  BMP showed a bicarb of 16 with anion gap 21.  Lactic acid peaked at 8.0.  WBC was 10.6, hemoglobin 14.1, platelets 241,000.  Alcohol level 131.  Chest x-ray was negative.  ABG on room air showed 7.3 12/12/86/18.  EKG shows sinus tachycardia with nonspecific T wave changes.  Assessment/Plan: Lactic acidosis/alcoholic ketosis -I do not suspect sepsis at this time given the patient is afebrile and hemodynamically stable -Continue bicarbonate drip -Repeat BMP today -Continue IV fluids -Salicylate level negative  Hypertensive urgency -The patient endorses poor compliance with his antihypertensive medications -Switch metoprolol to IV for now -Continue clonidine if patient is able to tolerate  p.o.  Nausea and Vomiting -lipase 50 -likely Etoh gastritis -start PPI -zofran prn  Alcohol abuse -Alcohol withdrawal protocol  Syncope -Patient saw Dr. 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  Greater than 50% spent face to face counseling and coordinating care.    Subjective: Patient denies fevers, chills, headache, chest pain, dyspnea, nausea, vomiting, diarrhea, abdominal pain, dysuria, hematuria, hematochezia, and melena.   Objective: Vitals:   05/14/19 0620 05/14/19 0630 05/14/19 0650 05/14/19 0707  BP: (!) 185/114 (!) 189/115 (!) 182/111 (!) 130/110  Pulse: (!) 105 (!) 105 99 96  Resp: (!) 26 (!) 27 (!) 24   Temp:      TempSrc:      SpO2: 99% 100% 97%   Weight:      Height:        Intake/Output Summary (Last 24 hours) at 05/14/2019 0735 Last data filed at 05/14/2019 0140 Gross per 24 hour  Intake 1600 ml  Output --  Net 1600 ml   Weight change:  Exam:   General:  Pt is alert, follows commands appropriately, not in acute  distress  HEENT: No icterus, No thrush, No neck mass, [place]/AT  Cardiovascular: RRR, S1/S2, no rubs, no gallops  Respiratory: CTA bilaterally, no wheezing, no crackles, no rhonchi  Abdomen: Soft/+BS, non tender, non distended, no guarding  Extremities: No edema, No lymphangitis, No petechiae, No rashes, no synovitis   Data Reviewed: I have personally reviewed following labs and imaging studies Basic Metabolic Panel: Recent Labs  Lab  05/13/19 2325 05/13/19 2327 05/14/19 0540  NA 143  --  142  K 3.5  --  3.8  CL 106  --  106  CO2 16*  --  20*  GLUCOSE 155*  --  110*  BUN 7  --  5*  CREATININE 1.42*  --  1.06  CALCIUM 8.9  --  8.2*  MG  --  1.5*  --   PHOS  --  4.3  --    Liver Function Tests: Recent Labs  Lab 05/13/19 2325 05/14/19 0540  AST 64* 48*  ALT 31 25  ALKPHOS 125 97  BILITOT 1.5* 1.6*  PROT 8.1 6.7  ALBUMIN 4.0 3.2*   Recent Labs  Lab 05/13/19 2325  LIPASE 50   No results for input(s): AMMONIA in the last 168 hours. Coagulation Profile: No results for input(s): INR, PROTIME in the last 168 hours. CBC: Recent Labs  Lab 05/13/19 2325 05/14/19 0540  WBC 10.6* 9.1  HGB 14.1 12.4*  HCT 42.9 38.3*  MCV 92.3 93.2  PLT 394 274   Cardiac Enzymes: Recent Labs  Lab 05/13/19 2325  CKTOTAL 266   BNP: Invalid input(s): POCBNP CBG: No results for input(s): GLUCAP in the last 168 hours. HbA1C: No results for input(s): HGBA1C in the last 72 hours. Urine analysis:    Component Value Date/Time   COLORURINE AMBER (A) 05/13/2019 2354   APPEARANCEUR HAZY (A) 05/13/2019 2354   LABSPEC 1.014 05/13/2019 2354   PHURINE 5.0 05/13/2019 2354   GLUCOSEU NEGATIVE 05/13/2019 2354   HGBUR SMALL (A) 05/13/2019 2354   BILIRUBINUR NEGATIVE 05/13/2019 [place] 05/13/2019 2354   PROTEINUR 30 (A) 05/13/2019 2354   NITRITE NEGATIVE 05/13/2019 2354   LEUKOCYTESUR NEGATIVE 05/13/2019 2354   Sepsis Labs: @LABRCNTIP (procalcitonin:4,lacticidven:4) ) Recent Results (from the past 240 hour(s))  Respiratory Panel by RT PCR (Flu A&B, Covid) - Nasopharyngeal Swab     Status: None   Collection Time: 05/14/19  1:58 AM   Specimen: Nasopharyngeal Swab  Result Value Ref Range Status   SARS Coronavirus 2 by RT PCR NEGATIVE NEGATIVE Final    Comment: (NOTE) SARS-CoV-2 target nucleic acids are NOT DETECTED. The SARS-CoV-2 RNA is generally detectable in upper respiratoy specimens during the  acute phase of infection. The lowest concentration of SARS-CoV-2 viral copies this assay can detect is 131 copies/mL. A negative result does not preclude SARS-Cov-2 infection and should not be used as the sole basis for treatment or other patient management decisions. A negative result may occur with  improper specimen collection/handling, submission of specimen other than nasopharyngeal swab, presence of viral mutation(s) within the areas targeted by this assay, and inadequate number of viral copies (<131 copies/mL). A negative result must be combined with clinical observations, patient history, and epidemiological information. The expected result is Negative. Fact Sheet for Patients:  PinkCheek.be Fact Sheet for Healthcare Providers:  GravelBags.it This test is not yet ap proved or cleared by the [place] FDA and  has been authorized for detection and/or diagnosis of SARS-CoV-2 by FDA under an Emergency Use Authorization (  EUA). This EUA will remain  in effect (meaning this test can be used) for the duration of the COVID-19 declaration under Section 564(b)(1) of the Act, 21 U.S.C. section 360bbb-3(b)(1), unless the authorization is terminated or revoked sooner.    Influenza A by PCR NEGATIVE NEGATIVE Final   Influenza B by PCR NEGATIVE NEGATIVE Final    Comment: (NOTE) The Xpert Xpress SARS-CoV-2/FLU/RSV assay is intended as an aid in  the diagnosis of influenza from Nasopharyngeal swab specimens and  should not be used as a sole basis for treatment. Nasal washings and  aspirates are unacceptable for Xpert Xpress SARS-CoV-2/FLU/RSV  testing. Fact Sheet for Patients: https://www.moore.com/ Fact Sheet for Healthcare Providers: https://www.young.biz/ This test is not yet approved or cleared by the [place] FDA and  has been authorized for detection and/or diagnosis of SARS-CoV-2  by  FDA under an Emergency Use Authorization (EUA). This EUA will remain  in effect (meaning this test can be used) for the duration of the  Covid-19 declaration under Section 564(b)(1) of the Act, 21  U.S.C. section 360bbb-3(b)(1), unless the authorization is  terminated or revoked. Performed at [facility], [address] [place], [place] [address]      Scheduled Meds: . cloNIDine  0.1 mg Oral BID  . folic acid  1 mg Oral Daily  . metoprolol tartrate  5 mg Intravenous Q6H  . multivitamin with minerals  1 tablet Oral Daily  . [START ON 05/15/2019] thiamine  100 mg Oral Daily   Or  . [START ON 05/15/2019] thiamine  100 mg Intravenous Daily   Continuous Infusions: . sodium chloride    . sodium bicarbonate 150 mEq in dextrose 5% 1000 mL 150 mEq (05/14/19 0622)    Procedures/Studies: DG Chest Port 1 View  Result Date: 05/13/2019 CLINICAL DATA:  Vomiting EXAM: PORTABLE CHEST 1 VIEW COMPARISON:  03/25/2019 FINDINGS: The heart size and mediastinal contours are within normal limits. Both lungs are clear. The visualized skeletal structures are unremarkable. IMPRESSION: No active disease. Electronically Signed   By: Jasmine Pang M.D.   On: 05/13/2019 23:44    Catarina Hartshorn, DO  [facility] Pager [phone]  If 7PM-7AM, please contact night-coverage [email or web address] Password [facility] 05/14/2019, 7:35 AM   LOS: 0 days

## 2019-05-14 NOTE — H&P (Signed)
History and Physical    Gabriel Zavala WCB:[id number] DOB: 1966/01/24 DOA: 05/13/2019  PCP: Glenda Chroman, MD   Patient coming from: Home.  I have personally briefly reviewed patient's old medical records in [place]  Chief Complaint: Vomiting.  HPI: Gabriel Zavala is a 54 y.o. male with medical history significant of golf, hyperlipidemia, hypertension, seizure disorder, syncopal episode, EtOH abuse, depression who is coming to the emergency department due to emesis after he has been drinking vodka during the day. The patient thinks he only vomited once. He denies abdominal pain, diarrhea, constipation, melena or hematochezia.  He stated that he binge drinks, but his daughter told Dr. Christy Gentles that he drinks heavily and has been depressed. He also fell the day before on his knees and he is complaining of pain in this area.  He was evaluated about 2 weeks ago by Dr. Lovena Le due to syncope and evaluation for possible ILR insertion.  He denies fever, chills, headache, rhinorrhea, sore throat, dyspnea, wheezing or hemoptysis.  He denies chest pain, palpitations, diaphoresis, lower extremity edema, but states he gets frequently lightheaded with positional changes.  No dysuria, frequency or hematuria.  No polyuria, polydipsia, polyphagia or blurred vision.  ED Course: Initial vital signs temperature 98.5 F, pulse 144, respirations 16, blood pressure 201/134 mmHg.  The patient received Zofran 4 mg IVP x1, NS 2000 mL bolus and LR 3000 mL bolus.  Poison control and PCCM were consulted.  UDS was negative.  Urinalysis was hazy in appearance with a small hemoglobinuria and proteinuria 30 mg/dL, the rest of the body including microscopic examination was unremarkable.  White count is 10.6, hemoglobin 14.1 g/dL and platelets 394.  CMP showed a CO2 of 16 mmol/L and anion gap of 21.  The rest of the electrolytes are within expected range.  Glucose 155 and creatinine 1.42 mg/dL.  AST U/L was 64  and total bilirubin 1.5 mg/dL.  His chest radiograph did not show any active cardiopulmonary disease.  Total CK 266 units/L.  Salicylate and acetaminophen levels were low.  Lactic acid was 7.7, then 8.0 and most recently 6.1 mmol/L.  Review of Systems: As per HPI otherwise 10 point review of systems negative.  Past Medical History:  Diagnosis Date  . Gout   . History of nuclear stress test 06/2017   "No evidence of pharmacologically induced myocardial ischemia or prior MI"  . Hypercholesteremia   . Hypertension   . Seizure ([facility]) 10/16/2018    Past Surgical History:  Procedure Laterality Date  . HERNIA REPAIR       reports that he has never smoked. He has never used smokeless tobacco. He reports current alcohol use. He reports previous drug use.  No Known Allergies  Family History  Problem Relation Age of Onset  . Hypertension Mother   . Hypertension Sister   . Hypertension Brother   . Seizures Brother        Secondary to TBI  . Hypertension Sister    Prior to Admission medications   Medication Sig Start Date End Date Taking? Authorizing Provider  allopurinol (ZYLOPRIM) 300 MG tablet Take 300 mg by mouth daily.     [provider]  aspirin EC 81 MG tablet Take 81 mg by mouth daily.    [provider]  cloNIDine (CATAPRES) 0.1 MG tablet Take 1 tablet (0.1 mg total) by mouth 2 (two) times daily. 04/30/19   Evans Lance, MD  fenofibrate (TRICOR) 145 MG tablet Take 145  mg by mouth once a week.     [provider]  MESTINON 60 MG tablet Take 30 mg by mouth 3 (three) times daily. 05/06/19   [provider]  metoprolol succinate (TOPROL-XL) 50 MG 24 hr tablet Take 1 tablet (50 mg total) by mouth daily. Take with or immediately following a meal. 04/30/19 07/29/19  Marinus Maw, MD  Multiple Vitamin (MULTIVITAMIN WITH MINERALS) TABS tablet Take 1 tablet by mouth daily.    [provider]  traZODone (DESYREL) 100 MG tablet Take 100 mg by  mouth at bedtime. 12/20/18   [provider]    Physical Exam: Vitals:   05/14/19 0030 05/14/19 0045 05/14/19 0100 05/14/19 0141  BP: (!) 201/129  (!) 209/129   Pulse:      Resp: (!) 24 17 (!) 23   Temp:    98.9 F (37.2 C)  TempSrc:    Oral  SpO2:      Weight:      Height:        Constitutional: NAD, calm, comfortable Eyes: PERRL, lids and conjunctivae are mildly injected. ENMT: Mucous membranes and lips are dry. Posterior pharynx clear of any exudate or lesions. Neck: Normal, supple, no masses, no thyromegaly Respiratory: Clear to auscultation bilaterally, no wheezing, no crackles. No accessory muscle use.  Cardiovascular: Sinus tachycardia in the 110s, no murmurs / rubs / gallops. No extremity edema. 2+ pedal pulses. No carotid bruits.  Abdomen: Nondistended, mildly obese.  BS positive. no tenderness, no masses palpated. No hepatosplenomegaly.  Musculoskeletal: no clubbing / cyanosis. Good ROM, no contractures. Normal muscle tone.  Skin: Positive aberrations both knees area. Neurologic: Positive tremors.  CN 2-12 grossly intact. Sensation intact, DTR normal.  Generalized weakness. Psychiatric:. Alert and oriented x 2, partially oriented to time.  He knows he is in the hospital.  Labs on Admission: I have personally reviewed following labs and imaging studies  CBC: Recent Labs  Lab 05/13/19 2325  WBC 10.6*  HGB 14.1  HCT 42.9  MCV 92.3  PLT 394   Basic Metabolic Panel: Recent Labs  Lab 05/13/19 2325  NA 143  K 3.5  CL 106  CO2 16*  GLUCOSE 155*  BUN 7  CREATININE 1.42*  CALCIUM 8.9   GFR: Estimated Creatinine Clearance: 65 mL/min (A) (by C-G formula based on SCr of 1.42 mg/dL (H)). Liver Function Tests: Recent Labs  Lab 05/13/19 2325  AST 64*  ALT 31  ALKPHOS 125  BILITOT 1.5*  PROT 8.1  ALBUMIN 4.0   Recent Labs  Lab 05/13/19 2325  LIPASE 50   No results for input(s): AMMONIA in the last 168 hours. Coagulation Profile: No results for  input(s): INR, PROTIME in the last 168 hours. Cardiac Enzymes: Recent Labs  Lab 05/13/19 2325  CKTOTAL 266   BNP (last 3 results) No results for input(s): PROBNP in the last 8760 hours. HbA1C: No results for input(s): HGBA1C in the last 72 hours. CBG: No results for input(s): GLUCAP in the last 168 hours. Lipid Profile: No results for input(s): CHOL, HDL, LDLCALC, TRIG, CHOLHDL, LDLDIRECT in the last 72 hours. Thyroid Function Tests: No results for input(s): TSH, T4TOTAL, FREET4, T3FREE, THYROIDAB in the last 72 hours. Anemia Panel: No results for input(s): VITAMINB12, FOLATE, FERRITIN, TIBC, IRON, RETICCTPCT in the last 72 hours. Urine analysis:    Component Value Date/Time   COLORURINE AMBER (A) 05/13/2019 2354   APPEARANCEUR HAZY (A) 05/13/2019 2354   LABSPEC 1.014 05/13/2019 2354  PHURINE 5.0 05/13/2019 2354   GLUCOSEU NEGATIVE 05/13/2019 2354   HGBUR SMALL (A) 05/13/2019 2354   BILIRUBINUR NEGATIVE 05/13/2019 2354   KETONESUR NEGATIVE 05/13/2019 2354   PROTEINUR 30 (A) 05/13/2019 2354   NITRITE NEGATIVE 05/13/2019 2354   LEUKOCYTESUR NEGATIVE 05/13/2019 2354    Radiological Exams on Admission: DG Chest Port 1 View  Result Date: 05/13/2019 CLINICAL DATA:  Vomiting EXAM: PORTABLE CHEST 1 VIEW COMPARISON:  03/25/2019 FINDINGS: The heart size and mediastinal contours are within normal limits. Both lungs are clear. The visualized skeletal structures are unremarkable. IMPRESSION: No active disease. Electronically Signed   By: Jasmine Pang M.D.   On: 05/13/2019 23:44   Echo 10/17/2018  IMPRESSIONS   1. The left ventricle has normal systolic function, with an ejection fraction of 55-60%. The cavity size was normal. There is mildly increased left ventricular wall thickness. Left ventricular diastolic Doppler parameters are consistent with impaired  relaxation. No evidence of left ventricular regional wall motion abnormalities.  2. The right ventricle has normal systolic  function. The cavity was normal. There is no increase in right ventricular wall thickness.  3. The aortic valve is tricuspid. Mild aortic annular calcification noted.  4. The mitral valve is grossly normal.  5. The tricuspid valve is grossly normal.  6. The aortic root is normal in size and structure.   EKG: Independently reviewed. Vent. rate 129 BPM PR interval * ms QRS duration 83 ms QT/QTc 327/479 ms P-R-T axes 26 -20 56 Sinus tachycardia Ventricular premature complex Borderline left axis deviation Borderline prolonged QT interval  Assessment/Plan Principal Problem:   Metabolic acidosis   Lactic acidosis With partial respiratory compensation. Dr. Bebe Shaggy discussed the case with poison control. They recommended IV fluids, but no fomepizole at this time. He also discussed the case with Dr. Warrick Parisian from PCCM. He recommended medicine admission, thiamine and IV fluids. He has received 5000 mL of IVF boluses. Bicarbonate infusion just started around 0530. Continue IVF NaCl 0.45% 125 mL/h.  Active Problems:   Hypertensive urgency Has not had a clonidine or metoprolol for 1 or 2 days. Also hospitalization stress, EtOH and hypomagnesemia. Metoprolol and clonidine were resumed with subsequent decrease in BP. Hydralazine 20 mg IVP x1 given earlier. Continue clonidine 0.1 mg p.o. twice daily. Metoprolol 5 mg IVP every 6 hours until tolerating p.o. intake. Consider antihypertensive infusion if no further progress.    History of syncopal episode Seen recently by Dr. Ladona Ridgel. Amlodipine and labetalol discontinued. Started on Toprol and clonidine. Medication compliance may be an issue.    Alcohol use CIWA protocol started. Magnesium has been supplemented. Thiamine, folate and MVI.    Hypomagnesemia Secondary to alcohol consumption. Magnesium sulfate 2 g IVPB x1    Elevated bilirubin Secondary to EtOH liver disease. Monitor hepatic function tests.    Hyperglycemia Monitor  CBG every 4 hours. Check hemoglobin A1c.    Hypercholesteremia  resume [facility].    Gout Not sure he is taking allopurinol. Resume allopurinol once tolerating oral intake.   DVT prophylaxis: SCDs. Code Status: Full code. Family Communication:  Disposition Plan: Admit for IV hydration, CIWA protocol and electrolyte replace. Consults called: [facility] and transition care team. Admission status: Inpatient/stepdown.   Bobette Mo MD [facility]  If 7PM-7AM, please contact night-coverage [email or web address]  05/14/2019, 3:00 AM   This document was prepared using Dragon voice recognition software and may contain some unintended transcription errors.

## 2019-05-14 NOTE — Progress Notes (Signed)
CRITICAL VALUE ALERT  Critical Value:  Ethanol 0.037                           Serum Osmol 335  Date & Time Notied:  05/14/2019 @ 0917  Provider Notified: Dr Tat  Orders Received/Actions taken:No new orders currently

## 2019-05-14 NOTE — ED Notes (Signed)
CRITICAL VALUE ALERT  Critical Value:  Lactic acid 7.7  Date & Time Notied:  05/14/2019 0125  Provider Notified: [email or web address]  Orders Received/Actions taken: n/a

## 2019-05-14 NOTE — ED Notes (Signed)
Date and time results received: 05/14/19 0316  Test: lactic acid  Critical Value: 8.0  Name of Provider Notified: Dr.[email or web address]  Orders Received? Or Actions Taken?:

## 2019-05-15 DIAGNOSIS — F10230 Alcohol dependence with withdrawal, uncomplicated: Secondary | ICD-10-CM

## 2019-05-15 DIAGNOSIS — E7800 Pure hypercholesterolemia, unspecified: Secondary | ICD-10-CM

## 2019-05-15 DIAGNOSIS — M25561 Pain in right knee: Secondary | ICD-10-CM

## 2019-05-15 LAB — CBC
HCT: 30.7 % — ABNORMAL LOW (ref 39.0–52.0)
Hemoglobin: 9.8 g/dL — ABNORMAL LOW (ref 13.0–17.0)
MCH: 30.2 pg (ref 26.0–34.0)
MCHC: 31.9 g/dL (ref 30.0–36.0)
MCV: 94.8 fL (ref 80.0–100.0)
Platelets: 208 10*3/uL (ref 150–400)
RBC: 3.24 MIL/uL — ABNORMAL LOW (ref 4.22–5.81)
RDW: 14.8 % (ref 11.5–15.5)
WBC: 6.3 10*3/uL (ref 4.0–10.5)
nRBC: 0 % (ref 0.0–0.2)

## 2019-05-15 LAB — C DIFFICILE QUICK SCREEN W PCR REFLEX
C Diff antigen: NEGATIVE
C Diff interpretation: NOT DETECTED
C Diff toxin: NEGATIVE

## 2019-05-15 LAB — GLUCOSE, CAPILLARY
Glucose-Capillary: 66 mg/dL — ABNORMAL LOW (ref 70–99)
Glucose-Capillary: 101 mg/dL — ABNORMAL HIGH (ref 70–99)
Glucose-Capillary: 96 mg/dL (ref 70–99)
Glucose-Capillary: 77 mg/dL (ref 70–99)
Glucose-Capillary: 56 mg/dL — ABNORMAL LOW (ref 70–99)
Glucose-Capillary: 61 mg/dL — ABNORMAL LOW (ref 70–99)
Glucose-Capillary: 132 mg/dL — ABNORMAL HIGH (ref 70–99)

## 2019-05-15 LAB — COMPREHENSIVE METABOLIC PANEL
ALT: 19 U/L (ref 0–44)
AST: 39 U/L (ref 15–41)
Albumin: 2.6 g/dL — ABNORMAL LOW (ref 3.5–5.0)
Alkaline Phosphatase: 74 U/L (ref 38–126)
Anion gap: 8 (ref 5–15)
BUN: <5 mg/dL — ABNORMAL LOW (ref 6–20)
CO2: 31 mmol/L (ref 22–32)
Calcium: 7.7 mg/dL — ABNORMAL LOW (ref 8.9–10.3)
Chloride: 101 mmol/L (ref 98–111)
Creatinine, Ser: 1.16 mg/dL (ref 0.61–1.24)
GFR calc Af Amer: >60 mL/min (ref >60)
GFR calc non Af Amer: >60 mL/min (ref >60)
Glucose, Bld: 94 mg/dL (ref 70–99)
Potassium: 3.5 mmol/L (ref 3.5–5.1)
Sodium: 140 mmol/L (ref 135–145)
Total Bilirubin: 2.1 mg/dL — ABNORMAL HIGH (ref 0.3–1.2)
Total Protein: 5.4 g/dL — ABNORMAL LOW (ref 6.5–8.1)

## 2019-05-15 LAB — HIV ANTIBODY (ROUTINE TESTING W REFLEX): HIV Screen 4th Generation wRfx: NONREACTIVE

## 2019-05-15 LAB — LACTIC ACID, PLASMA: Lactic Acid, Venous: 2 mmol/L (ref 0.5–1.9)

## 2019-05-15 LAB — MAGNESIUM: Magnesium: 1.4 mg/dL — ABNORMAL LOW (ref 1.7–2.4)

## 2019-05-15 MED ORDER — DEXTROSE 50 % IV SOLN
INTRAVENOUS | Status: AC
  Administered 2019-05-15: 50 mL
  Filled 2019-05-15: qty 50

## 2019-05-15 MED ORDER — ONDANSETRON HCL 4 MG/2ML IJ SOLN: 4.0000 mg | Freq: Four times a day (QID) | INTRAMUSCULAR | Status: DC | PRN

## 2019-05-15 MED ORDER — POTASSIUM CHLORIDE CRYS ER 20 MEQ PO TBCR
20.0000 meq | EXTENDED_RELEASE_TABLET | Freq: Once | ORAL | Status: AC
  Administered 2019-05-15: 20 meq via ORAL
  Filled 2019-05-15: qty 1

## 2019-05-15 MED ORDER — METOPROLOL TARTRATE 50 MG PO TABS
75.0000 mg | ORAL_TABLET | Freq: Two times a day (BID) | ORAL | Status: DC
  Administered 2019-05-15 – 2019-05-16 (×3): 75 mg via ORAL
  Filled 2019-05-15 (×3): qty 1

## 2019-05-15 MED ORDER — MAGNESIUM SULFATE 2 GM/50ML IV SOLN
2.0000 g | Freq: Once | INTRAVENOUS | Status: AC
  Administered 2019-05-15: 2 g via INTRAVENOUS
  Filled 2019-05-15: qty 50

## 2019-05-15 NOTE — Progress Notes (Signed)
Hypoglycemic Event  CBG: 66  Treatment: 4 oz Orange juice  Symptoms: None  Follow-up CBG: Time: 0937 CBG Result:101  Possible Reasons for Event: Not eating overnight.   Comments/MD notified: Tat, MD.    Gabriel Zavala Gabriel Zavala

## 2019-05-15 NOTE — Progress Notes (Signed)
Hypoglycemic Event  CBG: 58  Treatment: 8 oz juice/soda  Symptoms: None  Follow-up CBG: Time:10:17 PM  CBG Result:61  Possible Reasons for Event: Inadequate meal intake  Comments/MD notified    Jason Nest

## 2019-05-15 NOTE — Progress Notes (Signed)
Patient ID: Gabriel Zavala, male   DOB: April 12, 1966, 54 y.o.   MRN: [id number]   Patient re-evaluated by this provider along with TTS counselor (see counselors note for further details of evaluation). Patient denies SI, HI and AVH. He presents with a long history of alcohol abuse along with seizures secondary to his alcohol dependency. On a psychiatric standpoint, there is no evidence of imminent risk to self or others at present as such. patient does not meet criteria for psychiatric inpatient admission and will be psychiatrically cleared. We discussed, strongly recommended, and offered substance abuse treatment/resoruces and patient declined. If available, I will ask if peer support can get involved. Patient reports he does have outpatient psychiatric services. It is reccommended that he continue follow-up with services.   Patient is again psychiatrically cleared and inpatient psychiatric care is not recommended at this time.

## 2019-05-15 NOTE — Progress Notes (Signed)
Paged midlevel regarding bp

## 2019-05-15 NOTE — Progress Notes (Signed)
PROGRESS NOTE  Gabriel Zavala ZDG:[id number] DOB: December 03, 1965 DOA: 05/13/2019 PCP: Ignatius Specking, MD  Brief History:  54 year old male with a history of hyperlipidemia, hypertension, seizure disorder, alcohol dependence, depression presenting with nausea, vomiting, and elevated blood pressure.  The patient stated that he checked his blood pressure at home with an automated cuff that he bought.  He does not remember the actual blood pressure but stated that it was reading high.  He stated that he has had two episodes of emesis without any blood.  He denies any fevers, chills, chest pain, shortness breath, palpitations, syncope, abdominal pain, dysuria, hematuria.  There is no hematochezia or melena.  He denies any loss of consciousness.  He states that he has been drinking 1/2 pint of vodka approximately four times per week.  He last had alcohol on 05/13/2019.  He stated that he fell onto his knees when his legs slipped while getting out of bed on 05/13/2019. He denies any syncope. In the emergency department, the patient was afebrile and hemodynamically stable with oxygen saturation 100% room air.  In fact patient was hypertensive with blood pressure of 229/126.  He was tachycardic in the 140s.  BMP showed a bicarb of 16 with anion gap 21.  Lactic acid peaked at 8.0.  WBC was 10.6, hemoglobin 14.1, platelets 241,000.  Alcohol level 131.  Chest x-ray was negative.  ABG on room air showed 7.3 12/12/86/18.  EKG shows sinus tachycardia with nonspecific T wave changes.  Assessment/Plan: Lactic acidosis/alcoholic ketosis -I do not suspect sepsis at this time given the patient remains afebrile and hemodynamically stable -discontinue bicarbonate drip -Repeat BMP off bicarb -Continue IV fluids -Salicylate level negative -lactate 8.0>>>2.0  Hypertensive urgency -The patient endorses poor compliance with his antihypertensive medications -switch to po metoprolol-->increase to 75 mg  bid -Continue clonidine if patient is able to tolerate p.o.  Nausea and Vomiting -lipase 50 -likely Etoh gastritis -continue PPI -change zofran to prn -advance diet as tolerated  Alcohol abuse/Alcohol withdrawal -Alcohol withdrawal protocol -still having high CIWA scores  Syncope -Patient saw Dr. 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 -Also component of alcohol intoxication and possible alcohol withdrawal seizure -will need to tolerate higher baseline BP  Impaired glucose tolerance -10/16/2018 hemoglobin A1c 5.9  Hypomagnesemia -replete  Knee pain -obtain plain films--neg  -avoiding opioids  Depression -see by telepsychiatry -denies suicidal or homicidal ideation      Disposition Plan:   Home in 1-2 days when Etoh withdrawal improve  Family Communication:   No Family at bedside  Consultants:  none  Code Status:  FULL   DVT Prophylaxis:  Marcus Lovenox   Procedures: As Listed in Progress Note Above  Antibiotics: None    Total time spent 35 minutes.  Greater than 50% spent face to face counseling and coordinating care.     Subjective: Patient denies fevers, chills, headache, chest pain, dyspnea, nausea, vomiting, diarrhea, abdominal pain, dysuria, hematuria, hematochezia, and melena.   Objective: Vitals:   05/15/19 0400 05/15/19 0500 05/15/19 0600 05/15/19 0700  BP: (!) 175/105 (!) 167/97 (!) 183/99 (!) 182/102  Pulse: 73 69 65 68  Resp: 20 (!) 24 (!) 22 (!) 23  Temp: 98 F (36.7 C)     TempSrc: Axillary     SpO2: 90% 92% 93% 96%  Weight:  95.8 kg    Height:        Intake/Output Summary (Last 24  hours) at 05/15/2019 0714 Last data filed at 05/14/2019 1944 Gross per 24 hour  Intake 1403.35 ml  Output 300 ml  Net 1103.35 ml   Weight change: 2.2 kg Exam:   General:  Pt is alert, follows commands appropriately, not in acute distress  HEENT: No icterus, No thrush, No neck mass,  [place]/AT  Cardiovascular: RRR, S1/S2, no rubs, no gallops  Respiratory: CTA bilaterally, no wheezing, no crackles, no rhonchi  Abdomen: Soft/+BS, non tender, non distended, no guarding  Extremities: No edema, No lymphangitis, No petechiae, No rashes, no synovitis   Data Reviewed: I have personally reviewed following labs and imaging studies Basic Metabolic Panel: Recent Labs  Lab 05/13/19 2325 05/13/19 2327 05/14/19 0540 05/14/19 1121 05/15/19 0405  NA 143  --  142 139 140  K 3.5  --  3.8 3.6 3.5  CL 106  --  106 101 101  CO2 16*  --  20* 23 31  GLUCOSE 155*  --  110* 105* 94  BUN 7  --  5* 5* <5*  CREATININE 1.42*  --  1.06 1.16 1.16  CALCIUM 8.9  --  8.2* 8.1* 7.7*  MG  --  1.5*  --   --  1.4*  PHOS  --  4.3  --   --   --    Liver Function Tests: Recent Labs  Lab 05/13/19 2325 05/14/19 0540 05/14/19 1121 05/15/19 0405  AST 64* 48* 50* 39  ALT 31 25 25 19   ALKPHOS 125 97 97 74  BILITOT 1.5* 1.6* 2.3* 2.1*  PROT 8.1 6.7 6.8 5.4*  ALBUMIN 4.0 3.2* 3.3* 2.6*   Recent Labs  Lab 05/13/19 2325  LIPASE 50   No results for input(s): AMMONIA in the last 168 hours. Coagulation Profile: No results for input(s): INR, PROTIME in the last 168 hours. CBC: Recent Labs  Lab 05/13/19 2325 05/14/19 0540 05/15/19 0405  WBC 10.6* 9.1 6.3  HGB 14.1 12.4* 9.8*  HCT 42.9 38.3* 30.7*  MCV 92.3 93.2 94.8  PLT 394 274 208   Cardiac Enzymes: Recent Labs  Lab 05/13/19 2325  CKTOTAL 266   BNP: Invalid input(s): POCBNP CBG: Recent Labs  Lab 05/14/19 1056 05/14/19 1625 05/14/19 2108  GLUCAP 100* 113* 92   HbA1C: No results for input(s): HGBA1C in the last 72 hours. Urine analysis:    Component Value Date/Time   COLORURINE AMBER (A) 05/13/2019 2354   APPEARANCEUR HAZY (A) 05/13/2019 2354   LABSPEC 1.014 05/13/2019 2354   PHURINE 5.0 05/13/2019 2354   GLUCOSEU NEGATIVE 05/13/2019 2354   HGBUR SMALL (A) 05/13/2019 2354   BILIRUBINUR NEGATIVE 05/13/2019 2354    KETONESUR NEGATIVE 05/13/2019 2354   PROTEINUR 30 (A) 05/13/2019 2354   NITRITE NEGATIVE 05/13/2019 2354   LEUKOCYTESUR NEGATIVE 05/13/2019 2354   Sepsis Labs: @LABRCNTIP (procalcitonin:4,lacticidven:4) ) Recent Results (from the past 240 hour(s))  Respiratory Panel by RT PCR (Flu A&B, Covid) - Nasopharyngeal Swab     Status: None   Collection Time: 05/14/19  1:58 AM   Specimen: Nasopharyngeal Swab  Result Value Ref Range Status   SARS Coronavirus 2 by RT PCR NEGATIVE NEGATIVE Final    Comment: (NOTE) SARS-CoV-2 target nucleic acids are NOT DETECTED. The SARS-CoV-2 RNA is generally detectable in upper respiratoy specimens during the acute phase of infection. The lowest concentration of SARS-CoV-2 viral copies this assay can detect is 131 copies/mL. A negative result does not preclude SARS-Cov-2 infection and should not be used as the sole basis for  treatment or other patient management decisions. A negative result may occur with  improper specimen collection/handling, submission of specimen other than nasopharyngeal swab, presence of viral mutation(s) within the areas targeted by this assay, and inadequate number of viral copies (<131 copies/mL). A negative result must be combined with clinical observations, patient history, and epidemiological information. The expected result is Negative. Fact Sheet for Patients:  PinkCheek.be Fact Sheet for Healthcare Providers:  GravelBags.it This test is not yet ap proved or cleared by the [place] FDA and  has been authorized for detection and/or diagnosis of SARS-CoV-2 by FDA under an Emergency Use Authorization (EUA). This EUA will remain  in effect (meaning this test can be used) for the duration of the COVID-19 declaration under Section 564(b)(1) of the Act, 21 U.S.C. section 360bbb-3(b)(1), unless the authorization is terminated or revoked sooner.    Influenza A by PCR  NEGATIVE NEGATIVE Final   Influenza B by PCR NEGATIVE NEGATIVE Final    Comment: (NOTE) The Xpert Xpress SARS-CoV-2/FLU/RSV assay is intended as an aid in  the diagnosis of influenza from Nasopharyngeal swab specimens and  should not be used as a sole basis for treatment. Nasal washings and  aspirates are unacceptable for Xpert Xpress SARS-CoV-2/FLU/RSV  testing. Fact Sheet for Patients: PinkCheek.be Fact Sheet for Healthcare Providers: GravelBags.it This test is not yet approved or cleared by the [place] FDA and  has been authorized for detection and/or diagnosis of SARS-CoV-2 by  FDA under an Emergency Use Authorization (EUA). This EUA will remain  in effect (meaning this test can be used) for the duration of the  Covid-19 declaration under Section 564(b)(1) of the Act, 21  U.S.C. section 360bbb-3(b)(1), unless the authorization is  terminated or revoked. Performed at [facility], [address] [place], [place] [address]   MRSA PCR Screening     Status: None   Collection Time: 05/14/19  1:44 PM   Specimen: Nasal Mucosa; Nasopharyngeal  Result Value Ref Range Status   MRSA by PCR NEGATIVE NEGATIVE Final    Comment:        The GeneXpert MRSA Assay (FDA approved for NASAL specimens only), is one component of a comprehensive MRSA colonization surveillance program. It is not intended to diagnose MRSA infection nor to guide or monitor treatment for MRSA infections. Performed at [facility], [address] [place], [place] [address]   C difficile quick scan w PCR reflex     Status: None   Collection Time: 05/15/19  4:21 AM   Specimen: Stool  Result Value Ref Range Status   C Diff antigen NEGATIVE NEGATIVE Final   C Diff toxin NEGATIVE NEGATIVE Final   C Diff interpretation No C. difficile detected.  Final    Comment: Performed at [facility], [address] [place], [place] [address]     Scheduled Meds: .  Chlorhexidine Gluconate Cloth  6 each Topical Q0600  . cloNIDine  0.1 mg Oral BID  . enoxaparin (LOVENOX) injection  40 mg Subcutaneous Q24H  . folic acid  1 mg Oral Daily  . lidocaine  2 patch Transdermal Q24H  . metoprolol tartrate  75 mg Oral BID  . multivitamin with minerals  1 tablet Oral Daily  . ondansetron (ZOFRAN) IV  4 mg Intravenous Q6H  . pantoprazole (PROTONIX) IV  40 mg Intravenous Q12H  . thiamine  100 mg Oral Daily   Or  . thiamine  100 mg Intravenous Daily   Continuous Infusions: . 0.9 % NaCl  with KCl 20 mEq / L 125 mL/hr at 05/15/19 0301    Procedures/Studies: DG Knee 1-2 Views Left  Result Date: 05/14/2019 CLINICAL DATA:  Bilateral anterior knee pain. Recent fall yesterday. EXAM: LEFT KNEE - 1-2 VIEW COMPARISON:  None. FINDINGS: No evidence of fracture, dislocation, or joint effusion. No evidence of arthropathy or other focal bone abnormality. Soft tissues are unremarkable. IMPRESSION: No acute fracture. Electronically Signed   By: Elberta Fortis M.D.   On: 05/14/2019 08:50   DG Knee 1-2 Views Right  Result Date: 05/14/2019 CLINICAL DATA:  Bilateral anterior knee pain after fall yesterday. EXAM: RIGHT KNEE - 1-2 VIEW COMPARISON:  None. FINDINGS: No evidence of fracture, dislocation, or joint effusion. No evidence of arthropathy or other focal bone abnormality. Soft tissues are unremarkable. IMPRESSION: No acute fracture. Electronically Signed   By: Elberta Fortis M.D.   On: 05/14/2019 08:51   DG Chest Port 1 View  Result Date: 05/13/2019 CLINICAL DATA:  Vomiting EXAM: PORTABLE CHEST 1 VIEW COMPARISON:  03/25/2019 FINDINGS: The heart size and mediastinal contours are within normal limits. Both lungs are clear. The visualized skeletal structures are unremarkable. IMPRESSION: No active disease. Electronically Signed   By: Jasmine Pang M.D.   On: 05/13/2019 23:44    Catarina Hartshorn, DO  [facility] Pager [phone]  If 7PM-7AM, please contact  night-coverage [email or web address] Password [email or web address] 05/15/2019, 7:14 AM   LOS: 1 day

## 2019-05-15 NOTE — BH Assessment (Addendum)
Tele Assessment Note   Patient Name: Gabriel Zavala MRN: [id number] Referring Physician: Dr. Shanon Brow Tat Location of Patient: [facility] Location of Provider: [place] is a 54 y.o. male who was brought to [facility] due to vomiting and possibly falling the previous day. Pt states he has been drinking 1/2 pint of vodka 4 days a week. According to previous notes, pt's daughter reported that pt has been depressed and his sister reports there have been multiple occurrences of pt stating he wanted to harm himself and that he wanted to run into the road and let a car hit him. Despite calling the police on pt re: this, pt denies making these statements when the police arrive.  Pt denies current or a hx of SI or HI. He denies current AVH but states he experienced AVH "a long time ago." Pt was unable to elaborate. Pt states he "sees all the famous people" for services, but upon further exploration, it was determined that pt was most likely referring to when he went to rehab at [facility], which has a hx of having celebrities for clients. Pt's sister stated pt is not currently receiving services because he does not believe he needs them. Clinician offered pt referral information for inpatient rehab and outpatient SA resources but pt stated he needs to return to work and that he was uninterested in information at this time.  Pt gave verbal consent for clinician to contact his sister for collateral information.  Pt's sister, Gabriel Zavala, shared pt is currently living in a motel; he had initially gone to live with her sister upon pt's d/c from rehab but she asked him to leave once she realized he was drinking again. Pt's sister states pt has not worked in two months. She states she is unsure where/how pt has been getting his alcohol, but she believes his daughter might be providing it for him. She states she will be having a "talk" with pt's daughter today.    Pt is oriented x2; pt thought today's date was June 27, 2019 and it took him quite a while to identify that date and the year. Pt was also unable to identify why he was in the hospital; he told clinician a story of being with a group of people, including his brother, and them being chased. Pt's memory was UTA. Pt was cooperative throughout the assessment, though he had thought-blocking and it was difficult for him to answer questions. Pt's judgement is fair at this time; his insight and impulse control is poor.   Diagnosis: F10.24, Alcohol-induced depressive disorder, With moderate or severe use disorder   Past Medical History:  Past Medical History:  Diagnosis Date  . Gout   . History of nuclear stress test 06/2017   "No evidence of pharmacologically induced myocardial ischemia or prior MI"  . Hypercholesteremia   . Hypertension   . Seizure ([place]) 10/16/2018    Past Surgical History:  Procedure Laterality Date  . HERNIA REPAIR      Family History:  Family History  Problem Relation Age of Onset  . Hypertension Mother   . Hypertension Sister   . Hypertension Brother   . Seizures Brother        Secondary to TBI  . Hypertension Sister     Social History:  reports that he has never smoked. He has never used smokeless tobacco. He reports current alcohol use. He reports previous drug use.  Additional Social History:  Alcohol / Drug Use Pain Medications: Please see MAR Prescriptions: Please see MAR Over the Counter: Please see MAR History of alcohol / drug use?: Yes Longest period of sobriety (when/how long): Unknown Substance #1 Name of Substance 1: EtOH 1 - Age of First Use: Unknown 1 - Amount (size/oz): 1/2 pint of vodka 1 - Frequency: 4x/week 1 - Duration: Unknown 1 - Last Use / Amount: Unknown  CIWA: CIWA-Ar BP: 125/76 Pulse Rate: 72 Nausea and Vomiting: no nausea and no vomiting Tactile Disturbances: none Tremor: not visible, but can be felt fingertip to  fingertip Auditory Disturbances: very mild harshness or ability to frighten Paroxysmal Sweats: no sweat visible Visual Disturbances: very mild sensitivity Anxiety: two Headache, Fullness in Head: none present Agitation: somewhat more than normal activity Orientation and Clouding of Sensorium: oriented and can do serial additions CIWA-Ar Total: 6 COWS:    Allergies: No Known Allergies  Home Medications:  Medications Prior to Admission  Medication Sig Dispense Refill  . allopurinol (ZYLOPRIM) 300 MG tablet Take 300 mg by mouth daily.     Marland Kitchen aspirin EC 81 MG tablet Take 81 mg by mouth daily.    . cloNIDine (CATAPRES) 0.1 MG tablet Take 1 tablet (0.1 mg total) by mouth 2 (two) times daily. 60 tablet 11  . fenofibrate (TRICOR) 145 MG tablet Take 145 mg by mouth once a week.     . MESTINON 60 MG tablet Take 30 mg by mouth 3 (three) times daily.    . metoprolol succinate (TOPROL-XL) 50 MG 24 hr tablet Take 1 tablet (50 mg total) by mouth daily. Take with or immediately following a meal. 90 tablet 3  . Multiple Vitamin (MULTIVITAMIN WITH MINERALS) TABS tablet Take 1 tablet by mouth daily.    . traZODone (DESYREL) 100 MG tablet Take 100 mg by mouth at bedtime.      OB/GYN Status:  No LMP for male patient.  General Assessment Data Location of Assessment: [facility] TTS Assessment: In system Is this a Tele or Face-to-Face Assessment?: Tele Assessment Is this an Initial Assessment or a Re-assessment for this encounter?: Initial Assessment Patient Accompanied by:: N/A Language Other than English: No Living Arrangements: Other (Comment)(Pt lives in a pay-by-week motel) What gender do you identify as?: Male Marital status: Single Living Arrangements: Alone Can pt return to current living arrangement?: Yes Admission Status: Voluntary Is patient capable of signing voluntary admission?: Yes Referral Source: Self/Family/Friend Insurance type: BCBS COMM PPO     Crisis Care Plan Living  Arrangements: Alone Legal Guardian: Other:(Self) Name of Psychiatrist: None Name of Therapist: None  Education Status Is patient currently in school?: No Is the patient employed, unemployed or receiving disability?: Unemployed(Pt states employed; sister reports hasn't worked in 2 months)  Risk to self with the past 6 months Suicidal Ideation: (Pt denies; his sister states he's made comments/threats) Has patient been a risk to self within the past 6 months prior to admission? : No Suicidal Intent: (Pt denies; his sister states he's made comments/threats) Has patient had any suicidal intent within the past 6 months prior to admission? : No Is patient at risk for suicide?: (Pt denies; his sister states he's made comments/threats) Suicidal Plan?: Yes-Currently Present Has patient had any suicidal plan within the past 6 months prior to admission? : Yes Specify Current Suicidal Plan: Pt told his sister he planned to run out in front of a car to get hit Access to Means: Yes Specify Access to Suicidal Means: Pt has access  to cars to run in front of What has been your use of drugs/alcohol within the last 12 months?: EtOH Previous Attempts/Gestures: No How many times?: 0 Other Self Harm Risks: Pt is not caring for himself Triggers for Past Attempts: None known Intentional Self Injurious Behavior: None Family Suicide History: Unable to assess Recent stressful life event(s): Job Loss, Financial Problems(Pt lost his home, his job) Persecutory voices/beliefs?: No Depression: Yes Depression Symptoms: Isolating, Guilt, Loss of interest in usual pleasures, Feeling worthless/self pity Substance abuse history and/or treatment for substance abuse?: Yes Suicide prevention information given to non-admitted patients: Not applicable  Risk to Others within the past 6 months Homicidal Ideation: No Does patient have any lifetime risk of violence toward others beyond the six months prior to admission? :  No Thoughts of Harm to Others: No Current Homicidal Intent: No Current Homicidal Plan: No Access to Homicidal Means: No Identified Victim: None noted History of harm to others?: No Assessment of Violence: None Noted Violent Behavior Description: None noted Does patient have access to weapons?: No(Pt denies access to guns/weapons) Criminal Charges Pending?: No Does patient have a court date: No Is patient on probation?: No  Psychosis Hallucinations: None noted Delusions: None noted  Mental Status Report Appearance/Hygiene: In scrubs Eye Contact: Fair Motor Activity: Freedom of movement(Pt is sitting up in his hospital bed) Speech: Aphasic Level of Consciousness: Quiet/awake Mood: Apathetic Affect: Apathetic Anxiety Level: Minimal Thought Processes: Thought Blocking Judgement: Impaired Orientation: Person, Place Obsessive Compulsive Thoughts/Behaviors: Unable to Assess  Cognitive Functioning Concentration: Normal Memory: Unable to Assess Is patient IDD: No Insight: Poor Impulse Control: Poor Appetite: (UTA) Have you had any weight changes? : (UT) Sleep: Unable to Assess Total Hours of Sleep: (UTA) Vegetative Symptoms: Unable to Assess  ADLScreening [facility] Assessment Services) Patient's cognitive ability adequate to safely complete daily activities?: (UTA) Patient able to express need for assistance with ADLs?: (UTA) Independently performs ADLs?: (UTA)  Prior Inpatient Therapy Prior Inpatient Therapy: No(Pt completed a 30-day rehab program)  Prior Outpatient Therapy Prior Outpatient Therapy: No Does patient have an ACCT team?: No Does patient have Intensive In-House Services?  : No Does patient have [facility] services? : No Does patient have P4CC services?: No  ADL Screening (condition at time of admission) Patient's cognitive ability adequate to safely complete daily activities?: (UTA) Is the patient deaf or have difficulty hearing?: No Does the patient have  difficulty seeing, even when wearing glasses/contacts?: No Does the patient have difficulty concentrating, remembering, or making decisions?: Yes Patient able to express need for assistance with ADLs?: (UTA) Does the patient have difficulty dressing or bathing?: Yes Independently performs ADLs?: (UTA) Does the patient have difficulty walking or climbing stairs?: No Weakness of Legs: (UTA) Weakness of Arms/Hands: (UTA)  Home Assistive Devices/Equipment Home Assistive Devices/Equipment: (UTA)  Therapy Consults (therapy consults require a physician order) PT Evaluation Needed: (UTA) OT Evalulation Needed: (UTA) SLP Evaluation Needed: (UTA) Abuse/Neglect Assessment (Assessment to be complete while patient is alone) Abuse/Neglect Assessment Can Be Completed: Unable to assess, patient is non-responsive or altered mental status Physical Abuse: Denies Verbal Abuse: Denies Sexual Abuse: Denies Exploitation of patient/patient's resources: Denies Self-Neglect: Denies Values / Beliefs Cultural Requests During Hospitalization: (UTA) Spiritual Requests During Hospitalization: (UTA) Consults Spiritual Care Consult Needed: (UTA) Transition of Care Team Consult Needed: (UTA) Advance Directives (For Healthcare) Does Patient Have a Medical Advance Directive?: Unable to assess, patient is non-responsive or altered mental status Would patient like information on creating a medical advance directive?: No -  Patient declined Nutrition Screen- [facility] Adult/[facility] Patient's home diet: Regular Has the patient recently lost weight without trying?: No Has the patient been eating poorly because of a decreased appetite?: No Malnutrition Screening Tool Score: 0        Disposition: Denzil Magnuson, NP, reviewed pt's chart and information and was with clinician throughout the [facility] Assessment via Tele-Assessment machine and determined pt does not meet criteria for inpatient hospitalization. This information was  provided to pt's nurse, Ricard Dillon, at 1139, and pt's EDP, Dr. Arbutus Leas, at 1144.   Disposition Initial Assessment Completed for this Encounter: Yes Patient referred to: Other (Comment)(Pt can be psych cleared)  This service was provided via telemedicine using a 2-way, interactive audio and video technology.  Names of all persons participating in this telemedicine service and their role in this encounter. Name: Gabriel Zavala Role: Patient  Name: Gabriel Zavala Role: Patient's Sister  Name: Denzil Magnuson Role: Nurse Practitioner  Name: Duard Brady Role: Clinician    Ralph Dowdy 05/15/2019 11:14 AM

## 2019-05-16 LAB — CBC
HCT: 30.2 % — ABNORMAL LOW (ref 39.0–52.0)
Hemoglobin: 9.7 g/dL — ABNORMAL LOW (ref 13.0–17.0)
MCH: 30.5 pg (ref 26.0–34.0)
MCHC: 32.1 g/dL (ref 30.0–36.0)
MCV: 95 fL (ref 80.0–100.0)
Platelets: 191 10*3/uL (ref 150–400)
RBC: 3.18 MIL/uL — ABNORMAL LOW (ref 4.22–5.81)
RDW: 15.1 % (ref 11.5–15.5)
WBC: 6.5 10*3/uL (ref 4.0–10.5)
nRBC: 0 % (ref 0.0–0.2)

## 2019-05-16 LAB — COMPREHENSIVE METABOLIC PANEL
ALT: 20 U/L (ref 0–44)
AST: 42 U/L — ABNORMAL HIGH (ref 15–41)
Albumin: 2.6 g/dL — ABNORMAL LOW (ref 3.5–5.0)
Alkaline Phosphatase: 74 U/L (ref 38–126)
Anion gap: 6 (ref 5–15)
BUN: 5 mg/dL — ABNORMAL LOW (ref 6–20)
CO2: 28 mmol/L (ref 22–32)
Calcium: 7.8 mg/dL — ABNORMAL LOW (ref 8.9–10.3)
Chloride: 103 mmol/L (ref 98–111)
Creatinine, Ser: 1.1 mg/dL (ref 0.61–1.24)
GFR calc Af Amer: >60 mL/min (ref >60)
GFR calc non Af Amer: >60 mL/min (ref >60)
Glucose, Bld: 90 mg/dL (ref 70–99)
Potassium: 4.1 mmol/L (ref 3.5–5.1)
Sodium: 137 mmol/L (ref 135–145)
Total Bilirubin: 1.6 mg/dL — ABNORMAL HIGH (ref 0.3–1.2)
Total Protein: 5.4 g/dL — ABNORMAL LOW (ref 6.5–8.1)

## 2019-05-16 LAB — GLUCOSE, CAPILLARY
Glucose-Capillary: 72 mg/dL (ref 70–99)
Glucose-Capillary: 79 mg/dL (ref 70–99)
Glucose-Capillary: 80 mg/dL (ref 70–99)
Glucose-Capillary: 91 mg/dL (ref 70–99)

## 2019-05-16 LAB — MAGNESIUM: Magnesium: 1.7 mg/dL (ref 1.7–2.4)

## 2019-05-16 LAB — HEPATITIS B SURFACE ANTIGEN: Hepatitis B Surface Ag: NONREACTIVE

## 2019-05-16 LAB — HEPATITIS C ANTIBODY: HCV Ab: NONREACTIVE

## 2019-05-16 MED ORDER — METOPROLOL TARTRATE 50 MG PO TABS
100.0000 mg | ORAL_TABLET | Freq: Two times a day (BID) | ORAL | Status: DC
  Administered 2019-05-16 – 2019-05-17 (×2): 100 mg via ORAL
  Filled 2019-05-16 (×2): qty 2

## 2019-05-16 MED ORDER — THIAMINE HCL 100 MG/ML IJ SOLN
500.0000 mg | Freq: Three times a day (TID) | INTRAVENOUS | Status: DC
  Administered 2019-05-16 – 2019-05-17 (×3): 500 mg via INTRAVENOUS
  Filled 2019-05-16 (×6): qty 5

## 2019-05-16 MED ORDER — METOPROLOL TARTRATE 25 MG PO TABS
25.0000 mg | ORAL_TABLET | Freq: Once | ORAL | Status: AC
  Administered 2019-05-16: 25 mg via ORAL
  Filled 2019-05-16: qty 1

## 2019-05-16 MED ORDER — LOSARTAN POTASSIUM 50 MG PO TABS
25.0000 mg | ORAL_TABLET | Freq: Every day | ORAL | Status: DC
  Administered 2019-05-16 – 2019-05-17 (×2): 25 mg via ORAL
  Filled 2019-05-16 (×2): qty 1

## 2019-05-16 MED ORDER — MAGNESIUM OXIDE 400 (241.3 MG) MG PO TABS
400.0000 mg | ORAL_TABLET | Freq: Two times a day (BID) | ORAL | Status: DC
  Administered 2019-05-16 – 2019-05-17 (×3): 400 mg via ORAL
  Filled 2019-05-16 (×3): qty 1

## 2019-05-16 MED ORDER — HYDRALAZINE HCL 20 MG/ML IJ SOLN
10.0000 mg | Freq: Once | INTRAMUSCULAR | Status: AC
  Administered 2019-05-16: 10 mg via INTRAVENOUS
  Filled 2019-05-16: qty 1

## 2019-05-16 MED ORDER — MAGNESIUM SULFATE 2 GM/50ML IV SOLN
2.0000 g | Freq: Once | INTRAVENOUS | Status: AC
  Administered 2019-05-16: 2 g via INTRAVENOUS
  Filled 2019-05-16: qty 50

## 2019-05-16 NOTE — Progress Notes (Signed)
Patient has been fully alert and oriented today, able to answer questions appropriately and behavior WNL. Discussed patient's situation with him and he expressed a desire to go back to rehab. Notified Child psychotherapist of patient's desire.

## 2019-05-16 NOTE — Progress Notes (Signed)
PROGRESS NOTE  Gabriel Zavala QZR:[id number] DOB: 1965-11-06 DOA: 05/13/2019 PCP: Ignatius Specking, MD   Brief History: 54 year old male with a history of hyperlipidemia, hypertension, seizure disorder, alcohol dependence, depression presenting with nausea, vomiting, and elevated blood pressure. The patient stated that he checked his blood pressure at home with an automated cuff that he bought. He does not remember the actual blood pressure but stated that it was reading high. He stated that he has had two episodes of emesis without any blood. He denies any fevers, chills, chest pain, shortness breath, palpitations, syncope, abdominal pain, dysuria, hematuria. There is no hematochezia or melena. He denies any loss of consciousness. He states that he has been drinking 1/2 pint of vodka approximately four times per week. He last had alcohol on 05/13/2019. He stated that he fell onto his knees when his legs slipped while getting out of bed on 05/13/2019. He denies any syncope. In the emergency department, the patient was afebrile and hemodynamically stable with oxygen saturation 100% room air. In fact patient was hypertensive with blood pressure of 229/126. He was tachycardic in the 140s. BMP showed a bicarb of 16 with anion gap 21. Lactic acid peaked at 8.0. WBC was 10.6, hemoglobin 14.1, platelets 241,000. Alcohol level 131. Chest x-ray was negative. ABG on room air showed 7.3 12/12/86/18. EKG shows sinus tachycardia with nonspecific T wave changes.  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 -Also component of alcohol intoxication and possible alcohol withdrawal seizure -will need to tolerate higher baseline BP  Impaired glucose tolerance -10/16/2018 hemoglobin A1c 5.9  Hypomagnesemia -replete  Knee pain -obtain plain films--neg  -avoiding opioids  Depression -cleared by telepsychiatry -denies suicidal or homicidal ideation      Disposition Plan: Home in 1-2days when Etoh withdrawal improve  Family Communication:NoFamily at bedside  Consultants:none  Code Status: FULL   DVT Prophylaxis: [place] Lovenox   Procedures: As Listed in Progress Note Above  Antibiotics: None    Total time spent 35 minutes. Greater than 50% spent face to face counseling and coordinating care.   Subjective: Patient denies fevers, chills, headache, chest pain, dyspnea, nausea, vomiting, diarrhea, abdominal pain, dysuria, hematuria, hematochezia, and melena.   Objective: Vitals:   05/16/19 0400 05/16/19 0500 05/16/19 1000 05/16/19 1100  BP: (!) 178/101 (!) 172/108 (!) 160/96 (!) 178/104  Pulse:   85 77  Resp: 19 (!) 23 (!) 23 (!) 24  Temp: 98.3 F (36.8 C)     TempSrc: Oral     SpO2:   97% 96%  Weight:  99 kg    Height:        Intake/Output Summary (Last 24 hours) at 05/16/2019 1213 Last data filed at 05/16/2019 0500 Gross per 24 hour  Intake 994.71 ml  Output 1750 ml  Net -755.29 ml   Weight change: 5.6 kg Exam:   General:  Pt is alert, follows commands  appropriately, not in acute distress  HEENT: No icterus, No thrush, No neck mass, /AT  Cardiovascular: RRR, S1/S2, no rubs, no gallops  Respiratory: CTA bilaterally, no wheezing, no crackles, no rhonchi  Abdomen: Soft/+BS, non tender, non distended, no guarding  Extremities: No edema, No lymphangitis, No petechiae, No rashes, no synovitis   Data Reviewed: I have personally reviewed following labs and imaging studies Basic Metabolic Panel: Recent Labs  Lab 05/13/19 2325 05/13/19 2327 05/14/19 0540 05/14/19 1121 05/15/19 0405 05/16/19 0645  NA 143  --  142 139 140 137  K 3.5  --  3.8 3.6 3.5 4.1  CL 106  --  106 101 101 103  CO2 16*  --  20* 23 31 28   GLUCOSE 155*  --  110* 105* 94 90  BUN 7  --  5* 5* <5* 5*  CREATININE 1.42*  --  1.06 1.16 1.16 1.10  CALCIUM 8.9  --  8.2* 8.1* 7.7* 7.8*  MG  --  1.5*  --   --  1.4* 1.7  PHOS  --  4.3  --   --   --   --    Liver Function Tests: Recent Labs  Lab 05/13/19 2325 05/14/19 0540 05/14/19 1121 05/15/19 0405 05/16/19 0645  AST 64* 48* 50* 39 42*  ALT 31 25 25 19 20   ALKPHOS 125 97 97 74 74  BILITOT 1.5* 1.6* 2.3* 2.1* 1.6*  PROT 8.1 6.7 6.8 5.4* 5.4*  ALBUMIN 4.0 3.2* 3.3* 2.6* 2.6*   Recent Labs  Lab 05/13/19 2325  LIPASE 50   No results for input(s): AMMONIA in the last 168 hours. Coagulation Profile: No results for input(s): INR, PROTIME in the last 168 hours. CBC: Recent Labs  Lab 05/13/19 2325 05/14/19 0540 05/15/19 0405 05/16/19 0645  WBC 10.6* 9.1 6.3 6.5  HGB 14.1 12.4* 9.8* 9.7*  HCT 42.9 38.3* 30.7* 30.2*  MCV 92.3 93.2 94.8 95.0  PLT 394 274 208 191   Cardiac Enzymes: Recent Labs  Lab 05/13/19 2325  CKTOTAL 266   BNP: Invalid input(s): POCBNP CBG: Recent Labs  Lab 05/15/19 2147 05/15/19 2217 05/15/19 2245 05/16/19 0803 05/16/19 1156  GLUCAP 56* 61* 132* 79 91   HbA1C: No results for input(s): HGBA1C in the last 72 hours. Urine analysis:    Component Value Date/Time    COLORURINE AMBER (A) 05/13/2019 2354   APPEARANCEUR HAZY (A) 05/13/2019 2354   LABSPEC 1.014 05/13/2019 2354   PHURINE 5.0 05/13/2019 2354   GLUCOSEU NEGATIVE 05/13/2019 2354   HGBUR SMALL (A) 05/13/2019 2354   BILIRUBINUR NEGATIVE 05/13/2019 2354   KETONESUR NEGATIVE 05/13/2019 2354   PROTEINUR 30 (A) 05/13/2019 2354   NITRITE NEGATIVE 05/13/2019 2354   LEUKOCYTESUR NEGATIVE 05/13/2019 2354   Sepsis Labs: @LABRCNTIP (procalcitonin:4,lacticidven:4) ) Recent Results (from the past 240 hour(s))  Respiratory Panel by RT PCR (Flu A&B, Covid) - Nasopharyngeal Swab     Status: None   Collection Time: 05/14/19  1:58 AM   Specimen: Nasopharyngeal Swab  Result Value Ref Range Status   SARS Coronavirus 2 by RT PCR NEGATIVE NEGATIVE Final    Comment: (NOTE) SARS-CoV-2 target nucleic acids are NOT DETECTED. The SARS-CoV-2 RNA is generally detectable in upper respiratoy specimens during the acute phase of infection. The lowest concentration of SARS-CoV-2 viral copies this assay can detect is 131 copies/mL. A negative  result does not preclude SARS-Cov-2 infection and should not be used as the sole basis for treatment or other patient management decisions. A negative result may occur with  improper specimen collection/handling, submission of specimen other than nasopharyngeal swab, presence of viral mutation(s) within the areas targeted by this assay, and inadequate number of viral copies (<131 copies/mL). A negative result must be combined with clinical observations, patient history, and epidemiological information. The expected result is Negative. Fact Sheet for Patients:  PinkCheek.be Fact Sheet for Healthcare Providers:  GravelBags.it This test is not yet ap proved or cleared by the [place] FDA and  has been authorized for detection and/or diagnosis of SARS-CoV-2 by FDA under an Emergency Use Authorization (EUA). This EUA  will remain  in effect (meaning this test can be used) for the duration of the COVID-19 declaration under Section 564(b)(1) of the Act, 21 U.S.C. section 360bbb-3(b)(1), unless the authorization is terminated or revoked sooner.    Influenza A by PCR NEGATIVE NEGATIVE Final   Influenza B by PCR NEGATIVE NEGATIVE Final    Comment: (NOTE) The Xpert Xpress SARS-CoV-2/FLU/RSV assay is intended as an aid in  the diagnosis of influenza from Nasopharyngeal swab specimens and  should not be used as a sole basis for treatment. Nasal washings and  aspirates are unacceptable for Xpert Xpress SARS-CoV-2/FLU/RSV  testing. Fact Sheet for Patients: PinkCheek.be Fact Sheet for Healthcare Providers: GravelBags.it This test is not yet approved or cleared by the [place] FDA and  has been authorized for detection and/or diagnosis of SARS-CoV-2 by  FDA under an Emergency Use Authorization (EUA). This EUA will remain  in effect (meaning this test can be used) for the duration of the  Covid-19 declaration under Section 564(b)(1) of the Act, 21  U.S.C. section 360bbb-3(b)(1), unless the authorization is  terminated or revoked. Performed at [facility], [address], [place], [place] [address]   MRSA PCR Screening     Status: None   Collection Time: 05/14/19  1:44 PM   Specimen: Nasal Mucosa; Nasopharyngeal  Result Value Ref Range Status   MRSA by PCR NEGATIVE NEGATIVE Final    Comment:        The GeneXpert MRSA Assay (FDA approved for NASAL specimens only), is one component of a comprehensive MRSA colonization surveillance program. It is not intended to diagnose MRSA infection nor to guide or monitor treatment for MRSA infections. Performed at [facility], [address] [place], [place] [address]   C difficile quick scan w PCR reflex     Status: None   Collection Time: 05/15/19  4:21 AM   Specimen: Stool  Result Value Ref Range  Status   C Diff antigen NEGATIVE NEGATIVE Final   C Diff toxin NEGATIVE NEGATIVE Final   C Diff interpretation No C. difficile detected.  Final    Comment: Performed at [facility], [address] [place], [place] [address]     Scheduled Meds: . Chlorhexidine Gluconate Cloth  6 each Topical Q0600  . cloNIDine  0.1 mg Oral BID  . enoxaparin (LOVENOX) injection  40 mg Subcutaneous Q24H  . folic acid  1 mg Oral Daily  . lidocaine  2 patch Transdermal Q24H  . losartan  25 mg Oral Daily  . metoprolol tartrate  100 mg Oral BID  . metoprolol tartrate  25 mg Oral Once  . multivitamin with minerals  1 tablet Oral Daily  . pantoprazole (PROTONIX) IV  40 mg Intravenous Q12H  . thiamine  100 mg Oral Daily   Or  . thiamine  100 mg Intravenous Daily   Continuous Infusions: . 0.9 % NaCl with KCl 20 mEq / L 125 mL/hr at 05/16/19 0424    Procedures/Studies: DG Knee 1-2 Views Left  Result Date: 05/14/2019 CLINICAL DATA:  Bilateral anterior knee pain. Recent fall yesterday. EXAM: LEFT KNEE - 1-2 VIEW COMPARISON:  None. FINDINGS: No evidence of fracture, dislocation, or joint effusion. No evidence of arthropathy or other focal bone abnormality. Soft tissues are unremarkable. IMPRESSION: No acute fracture. Electronically Signed   By: Elberta Fortis M.D.   On: 05/14/2019 08:50   DG Knee 1-2 Views Right  Result Date: 05/14/2019 CLINICAL DATA:  Bilateral anterior knee pain after fall yesterday. EXAM: RIGHT KNEE - 1-2 VIEW COMPARISON:  None. FINDINGS: No evidence of fracture, dislocation, or joint effusion. No evidence of arthropathy or other focal bone abnormality. Soft tissues are unremarkable. IMPRESSION: No acute fracture. Electronically Signed   By: Elberta Fortis M.D.   On: 05/14/2019 08:51   DG Chest Port 1 View  Result Date: 05/13/2019 CLINICAL DATA:  Vomiting EXAM: PORTABLE CHEST 1 VIEW COMPARISON:  03/25/2019 FINDINGS: The heart size and mediastinal contours are within normal limits. Both lungs  are clear. The visualized skeletal structures are unremarkable. IMPRESSION: No active disease. Electronically Signed   By: Jasmine Pang M.D.   On: 05/13/2019 23:44    Catarina Hartshorn, DO  [facility] Pager [phone]  If 7PM-7AM, please contact night-coverage [email or web address] Password [email or web address] 05/16/2019, 12:13 PM   LOS: 2 days

## 2019-05-16 NOTE — Progress Notes (Signed)
Dr. Bruna Potter made aware of elevated BP after patient being lowered to ground. New order for hydralazine 10mg  once.

## 2019-05-16 NOTE — Progress Notes (Signed)
Patient attempted to ambulate to bathroom unassisted. Bed alarm going off and per tech, she entered room and patient became unsteady near bathroom door. While holding on to wall, he was lowered to the ground by tech. No apparent injuries, denies pain. Condom catheter was in place, re-oriented about condom catheter and how it works, advising him to call for assistance if he had to have a BM. Bed alarm on, bed in low position, call bell within reach. Will continue to monitor.

## 2019-05-16 NOTE — Progress Notes (Signed)
Patient Gabriel Zavala 178/104 one hour after receiving morning Gabriel Zavala medication. Patient resting in bed asleep. Notified Dr. Arbutus Leas with no new orders at this time. Will continue to monitor.

## 2019-05-17 DIAGNOSIS — R1110 Vomiting, unspecified: Secondary | ICD-10-CM

## 2019-05-17 DIAGNOSIS — R112 Nausea with vomiting, unspecified: Secondary | ICD-10-CM

## 2019-05-17 LAB — COMPREHENSIVE METABOLIC PANEL
ALT: 20 U/L (ref 0–44)
AST: 47 U/L — ABNORMAL HIGH (ref 15–41)
Albumin: 2.9 g/dL — ABNORMAL LOW (ref 3.5–5.0)
Alkaline Phosphatase: 75 U/L (ref 38–126)
Anion gap: 11 (ref 5–15)
BUN: 5 mg/dL — ABNORMAL LOW (ref 6–20)
CO2: 21 mmol/L — ABNORMAL LOW (ref 22–32)
Calcium: 8.6 mg/dL — ABNORMAL LOW (ref 8.9–10.3)
Chloride: 103 mmol/L (ref 98–111)
Creatinine, Ser: 0.91 mg/dL (ref 0.61–1.24)
GFR calc Af Amer: >60 mL/min (ref >60)
GFR calc non Af Amer: >60 mL/min (ref >60)
Glucose, Bld: 80 mg/dL (ref 70–99)
Potassium: 4.4 mmol/L (ref 3.5–5.1)
Sodium: 135 mmol/L (ref 135–145)
Total Bilirubin: 1.2 mg/dL (ref 0.3–1.2)
Total Protein: 6 g/dL — ABNORMAL LOW (ref 6.5–8.1)

## 2019-05-17 LAB — MAGNESIUM: Magnesium: 2.1 mg/dL (ref 1.7–2.4)

## 2019-05-17 LAB — GLUCOSE, CAPILLARY
Glucose-Capillary: 91 mg/dL (ref 70–99)
Glucose-Capillary: 123 mg/dL — ABNORMAL HIGH (ref 70–99)

## 2019-05-17 MED ORDER — CLONIDINE HCL 0.1 MG PO TABS
0.1000 mg | ORAL_TABLET | Freq: Once | ORAL | Status: AC
  Administered 2019-05-17: 0.1 mg via ORAL

## 2019-05-17 MED ORDER — CLONIDINE HCL 0.2 MG PO TABS
0.2000 mg | ORAL_TABLET | Freq: Two times a day (BID) | ORAL | Status: DC
  Filled 2019-05-17: qty 1

## 2019-05-17 MED ORDER — THIAMINE HCL 100 MG/ML IJ SOLN
INTRAMUSCULAR | Status: AC
  Filled 2019-05-17: qty 6

## 2019-05-17 MED ORDER — AMLODIPINE BESYLATE 5 MG PO TABS
5.0000 mg | ORAL_TABLET | Freq: Every day | ORAL | Status: DC
  Administered 2019-05-17: 5 mg via ORAL
  Filled 2019-05-17: qty 1

## 2019-05-17 MED ORDER — METOPROLOL SUCCINATE ER 200 MG PO TB24: 200.0000 mg | ORAL_TABLET | Freq: Every day | ORAL | 1 refills | Status: AC

## 2019-05-17 MED ORDER — CLONIDINE HCL 0.2 MG PO TABS
0.2000 mg | ORAL_TABLET | Freq: Once | ORAL | Status: AC
  Administered 2019-05-17: 0.2 mg via ORAL
  Filled 2019-05-17: qty 1

## 2019-05-17 MED ORDER — SPIRONOLACTONE 25 MG PO TABS
25.0000 mg | ORAL_TABLET | Freq: Every day | ORAL | Status: DC
  Administered 2019-05-17: 25 mg via ORAL
  Filled 2019-05-17: qty 1

## 2019-05-17 MED ORDER — SPIRONOLACTONE 25 MG PO TABS: 25.0000 mg | ORAL_TABLET | Freq: Every day | ORAL | 1 refills | Status: AC

## 2019-05-17 MED ORDER — CHLORTHALIDONE 25 MG PO TABS: 25.0000 mg | ORAL_TABLET | Freq: Every day | ORAL | Status: DC

## 2019-05-17 MED ORDER — CHLORTHALIDONE 25 MG PO TABS: 25.0000 mg | ORAL_TABLET | Freq: Every day | ORAL | 1 refills | Status: AC

## 2019-05-17 MED ORDER — AMLODIPINE BESYLATE 5 MG PO TABS: 10.0000 mg | ORAL_TABLET | Freq: Every day | ORAL | Status: DC

## 2019-05-17 MED ORDER — CLONIDINE HCL 0.2 MG PO TABS: 0.2000 mg | ORAL_TABLET | Freq: Two times a day (BID) | ORAL | 1 refills | Status: AC

## 2019-05-17 NOTE — Progress Notes (Signed)
BS 72, orange juice given offered snack, at bedside.

## 2019-05-17 NOTE — Plan of Care (Signed)

## 2019-05-17 NOTE — Discharge Summary (Signed)
Physician Discharge Summary  Gabriel Zavala FWY:[id number] DOB: Nov 20, 1965 DOA: 05/13/2019  PCP: Ignatius Specking, MD  Admit date: 05/13/2019 Discharge date: 05/17/2019  Admitted From: Home Disposition:  Home   Recommendations for Outpatient Follow-up:  1. Follow up with PCP in 1-2 weeks 2. Please obtain BMP/CBC in one week   Discharge Condition: Stable CODE STATUS: FULL Diet recommendation: Heart Healthy    Brief/Interim Summary: 54 year old male with a history of hyperlipidemia, hypertension, seizure disorder, alcohol dependence, depression presenting with nausea, vomiting, and elevated blood pressure. The patient stated that he checked his blood pressure at home with an automated cuff that he bought. He does not remember the actual blood pressure but stated that it was reading high. He stated that he has had two episodes of emesis without any blood. He denies any fevers, chills, chest pain, shortness breath, palpitations, syncope, abdominal pain, dysuria, hematuria. There is no hematochezia or melena. He denies any loss of consciousness. He states that he has been drinking 1/2 pint of vodka approximately four times per week. He last had alcohol on 05/13/2019. He stated that he fell onto his knees when his legs slipped while getting out of bed on 05/13/2019. He denies any syncope. In the emergency department, the patient was afebrile and hemodynamically stable with oxygen saturation 100% room air. In fact patient was hypertensive with blood pressure of 229/126. He was tachycardic in the 140s. BMP showed a bicarb of 16 with anion gap 21. Lactic acid peaked at 8.0. WBC was 10.6, hemoglobin 14.1, platelets 241,000. Alcohol level 131. Chest x-ray was negative. ABG on room air showed 7.3 12/12/86/18. EKG shows sinus tachycardia with nonspecific T wave changes.  He was treated conservatively with IVF, PPI, antiemetics, and bowel rest.  He improved clinically and his diet was  gradually advanced which he tolerated.  He developed mild alcohol withdrawal which improved with CIWA protocol.  His anti-HTN meds were adjusted due to poorly controlled HTN with the understanding that he would need to tolerated a higher baseline BP due to his dysautonomia and syncope issues.  Dr. Olga Millers recommendations were taken into consideration with adjustment of his regimen.  Discharge Diagnoses:  Lactic acidosis/alcoholic ketosis -Ido not suspect sepsis at this time given the patientremainsafebrile and hemodynamically stable -discontinued bicarbonate drip which was started initially -Repeat BMPoff bicarb-->stable and improved -Continue IV fluids -Salicylate level negative -lactate 8.0>>>2.0  Hypertensive urgency -The patient endorses poor compliance with his antihypertensive medications -switch to po metoprolol-->increase to 100 mg bid>>>d/c home with metoprolol succinate 200 mg daily -Continue clonidine-->increased to 0.2 mg bid -add chlorthalidone and spironolactone -will ned to tolerate higher BP given pt's syncope from his dysautonomia although the dysautonomia likely from Etoh  Nausea and Vomiting -lipase 50 -due to Etoh gastritis -continuePPI -change zofran to prn -advanced diet which he tolerated  Alcohol abuse/Alcohol withdrawal -Alcohol withdrawal protocol -improved  Syncope -Patient saw Dr. 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 -Also component of alcohol intoxication and possible alcohol withdrawal seizure -will need to tolerate higher baseline BP  Impaired glucose tolerance -10/16/2018 hemoglobin A1c 5.9  Hypomagnesemia -repleted  Knee pain -obtain plain films--neg -avoiding opioids  Depression -cleared by telepsychiatry -denies suicidal or homicidal ideation   Discharge Instructions   Allergies as of 05/17/2019   No Known Allergies     Medication List    STOP taking these medications     labetalol 200 MG tablet Commonly known as: NORMODYNE     TAKE these  medications   allopurinol 300 MG tablet Commonly known as: ZYLOPRIM Take 300 mg by mouth daily.   aspirin EC 81 MG tablet Take 81 mg by mouth daily.   chlorthalidone 25 MG tablet Commonly known as: HYGROTON Take 1 tablet (25 mg total) by mouth daily. Start taking on: May 18, 2019   cloNIDine 0.2 MG tablet Commonly known as: CATAPRES Take 1 tablet (0.2 mg total) by mouth 2 (two) times daily. What changed:   medication strength  how much to take   fenofibrate 145 MG tablet Commonly known as: TRICOR Take 145 mg by mouth daily.   Lomotil 2.5-0.025 MG tablet Generic drug: diphenoxylate-atropine Take 1 tablet by mouth daily.   Mestinon 60 MG tablet Generic drug: pyridostigmine Take 30 mg by mouth 3 (three) times daily.   metoprolol 200 MG 24 hr tablet Commonly known as: TOPROL-XL Take 1 tablet (200 mg total) by mouth daily. Take with or immediately following a meal. What changed:   medication strength  how much to take   multivitamin with minerals Tabs tablet Take 1 tablet by mouth daily.   spironolactone 25 MG tablet Commonly known as: ALDACTONE Take 1 tablet (25 mg total) by mouth daily.   traZODone 100 MG tablet Commonly known as: DESYREL Take 100 mg by mouth at bedtime.       No Known Allergies  Consultations:  telepsychiatry   Procedures/Studies: DG Knee 1-2 Views Left  Result Date: 05/14/2019 CLINICAL DATA:  Bilateral anterior knee pain. Recent fall yesterday. EXAM: LEFT KNEE - 1-2 VIEW COMPARISON:  None. FINDINGS: No evidence of fracture, dislocation, or joint effusion. No evidence of arthropathy or other focal bone abnormality. Soft tissues are unremarkable. IMPRESSION: No acute fracture. Electronically Signed   By: Elberta Fortisaniel  Boyle M.D.   On: 05/14/2019 08:50   DG Knee 1-2 Views Right  Result Date: 05/14/2019 CLINICAL DATA:  Bilateral anterior knee pain after fall  yesterday. EXAM: RIGHT KNEE - 1-2 VIEW COMPARISON:  None. FINDINGS: No evidence of fracture, dislocation, or joint effusion. No evidence of arthropathy or other focal bone abnormality. Soft tissues are unremarkable. IMPRESSION: No acute fracture. Electronically Signed   By: Elberta Fortisaniel  Boyle M.D.   On: 05/14/2019 08:51   DG Chest Port 1 View  Result Date: 05/13/2019 CLINICAL DATA:  Vomiting EXAM: PORTABLE CHEST 1 VIEW COMPARISON:  03/25/2019 FINDINGS: The heart size and mediastinal contours are within normal limits. Both lungs are clear. The visualized skeletal structures are unremarkable. IMPRESSION: No active disease. Electronically Signed   By: Jasmine PangKim  Fujinaga M.D.   On: 05/13/2019 23:44         Discharge Exam: Vitals:   05/17/19 0626 05/17/19 1030  BP: (!) 196/103 (!) 180/103  Pulse: 70 70  Resp: 18 16  Temp: 99.5 F (37.5 C) 99.2 F (37.3 C)  SpO2: 98% 99%   Vitals:   05/16/19 2044 05/16/19 2230 05/17/19 0626 05/17/19 1030  BP: (!) 216/117 (!) 198/111 (!) 196/103 (!) 180/103  Pulse: 75 79 70 70  Resp:  20 18 16   Temp:  98.9 F (37.2 C) 99.5 F (37.5 C) 99.2 F (37.3 C)  TempSrc:  Oral Oral Oral  SpO2:  100% 98% 99%  Weight:      Height:        General: Pt is alert, awake, not in acute distress Cardiovascular: RRR, S1/S2 +, no rubs, no gallops Respiratory: CTA bilaterally, no wheezing, no rhonchi Abdominal: Soft, NT, ND, bowel sounds + Extremities: no edema, no cyanosis  The results of significant diagnostics from this hospitalization (including imaging, microbiology, ancillary and laboratory) are listed below for reference.    Significant Diagnostic Studies: DG Knee 1-2 Views Left  Result Date: 05/14/2019 CLINICAL DATA:  Bilateral anterior knee pain. Recent fall yesterday. EXAM: LEFT KNEE - 1-2 VIEW COMPARISON:  None. FINDINGS: No evidence of fracture, dislocation, or joint effusion. No evidence of arthropathy or other focal bone abnormality. Soft tissues are  unremarkable. IMPRESSION: No acute fracture. Electronically Signed   By: Elberta Fortis M.D.   On: 05/14/2019 08:50   DG Knee 1-2 Views Right  Result Date: 05/14/2019 CLINICAL DATA:  Bilateral anterior knee pain after fall yesterday. EXAM: RIGHT KNEE - 1-2 VIEW COMPARISON:  None. FINDINGS: No evidence of fracture, dislocation, or joint effusion. No evidence of arthropathy or other focal bone abnormality. Soft tissues are unremarkable. IMPRESSION: No acute fracture. Electronically Signed   By: Elberta Fortis M.D.   On: 05/14/2019 08:51   DG Chest Port 1 View  Result Date: 05/13/2019 CLINICAL DATA:  Vomiting EXAM: PORTABLE CHEST 1 VIEW COMPARISON:  03/25/2019 FINDINGS: The heart size and mediastinal contours are within normal limits. Both lungs are clear. The visualized skeletal structures are unremarkable. IMPRESSION: No active disease. Electronically Signed   By: Jasmine Pang M.D.   On: 05/13/2019 23:44     Microbiology: Recent Results (from the past 240 hour(s))  Respiratory Panel by RT PCR (Flu A&B, Covid) - Nasopharyngeal Swab     Status: None   Collection Time: 05/14/19  1:58 AM   Specimen: Nasopharyngeal Swab  Result Value Ref Range Status   SARS Coronavirus 2 by RT PCR NEGATIVE NEGATIVE Final    Comment: (NOTE) SARS-CoV-2 target nucleic acids are NOT DETECTED. The SARS-CoV-2 RNA is generally detectable in upper respiratoy specimens during the acute phase of infection. The lowest concentration of SARS-CoV-2 viral copies this assay can detect is 131 copies/mL. A negative result does not preclude SARS-Cov-2 infection and should not be used as the sole basis for treatment or other patient management decisions. A negative result may occur with  improper specimen collection/handling, submission of specimen other than nasopharyngeal swab, presence of viral mutation(s) within the areas targeted by this assay, and inadequate number of viral copies (<131 copies/mL). A negative result must be  combined with clinical observations, patient history, and epidemiological information. The expected result is Negative. Fact Sheet for Patients:  https://www.moore.com/ Fact Sheet for Healthcare Providers:  https://www.young.biz/ This test is not yet ap proved or cleared by the [place] FDA and  has been authorized for detection and/or diagnosis of SARS-CoV-2 by FDA under an Emergency Use Authorization (EUA). This EUA will remain  in effect (meaning this test can be used) for the duration of the COVID-19 declaration under Section 564(b)(1) of the Act, 21 U.S.C. section 360bbb-3(b)(1), unless the authorization is terminated or revoked sooner.    Influenza A by PCR NEGATIVE NEGATIVE Final   Influenza B by PCR NEGATIVE NEGATIVE Final    Comment: (NOTE) The Xpert Xpress SARS-CoV-2/FLU/RSV assay is intended as an aid in  the diagnosis of influenza from Nasopharyngeal swab specimens and  should not be used as a sole basis for treatment. Nasal washings and  aspirates are unacceptable for Xpert Xpress SARS-CoV-2/FLU/RSV  testing. Fact Sheet for Patients: https://www.moore.com/ Fact Sheet for Healthcare Providers: https://www.young.biz/ This test is not yet approved or cleared by the [place] FDA and  has been authorized for detection and/or diagnosis of SARS-CoV-2 by  FDA under  an Emergency Use Authorization (EUA). This EUA will remain  in effect (meaning this test can be used) for the duration of the  Covid-19 declaration under Section 564(b)(1) of the Act, 21  U.S.C. section 360bbb-3(b)(1), unless the authorization is  terminated or revoked. Performed at [facility], [address], [place], [place] [address]   MRSA PCR Screening     Status: None   Collection Time: 05/14/19  1:44 PM   Specimen: Nasal Mucosa; Nasopharyngeal  Result Value Ref Range Status   MRSA by PCR NEGATIVE NEGATIVE Final     Comment:        The GeneXpert MRSA Assay (FDA approved for NASAL specimens only), is one component of a comprehensive MRSA colonization surveillance program. It is not intended to diagnose MRSA infection nor to guide or monitor treatment for MRSA infections. Performed at [facility], [address] [place], [place] [address]   C difficile quick scan w PCR reflex     Status: None   Collection Time: 05/15/19  4:21 AM   Specimen: Stool  Result Value Ref Range Status   C Diff antigen NEGATIVE NEGATIVE Final   C Diff toxin NEGATIVE NEGATIVE Final   C Diff interpretation No C. difficile detected.  Final    Comment: Performed at [facility], [address], [place], [place] [address]     Labs: Basic Metabolic Panel: Recent Labs  Lab 05/13/19 2325 05/13/19 2327 05/14/19 0540 05/14/19 0540 05/14/19 1121 05/14/19 1121 05/15/19 0405 05/15/19 0405 05/16/19 0645 05/17/19 0626  NA   < >  --  142  --  139  --  140  --  137 135  K   < >  --  3.8   < > 3.6   < > 3.5   < > 4.1 4.4  CL   < >  --  106  --  101  --  101  --  103 103  CO2   < >  --  20*  --  23  --  31  --  28 21*  GLUCOSE   < >  --  110*  --  105*  --  94  --  90 80  BUN   < >  --  5*  --  5*  --  <5*  --  5* 5*  CREATININE   < >  --  1.06  --  1.16  --  1.16  --  1.10 0.91  CALCIUM   < >  --  8.2*  --  8.1*  --  7.7*  --  7.8* 8.6*  MG  --  1.5*  --   --   --   --  1.4*  --  1.7 2.1  PHOS  --  4.3  --   --   --   --   --   --   --   --    < > = values in this interval not displayed.   Liver Function Tests: Recent Labs  Lab 05/14/19 0540 05/14/19 1121 05/15/19 0405 05/16/19 0645 05/17/19 0626  AST 48* 50* 39 42* 47*  ALT 25 25 19 20 20   ALKPHOS 97 97 74 74 75  BILITOT 1.6* 2.3* 2.1* 1.6* 1.2  PROT 6.7 6.8 5.4* 5.4* 6.0*  ALBUMIN 3.2* 3.3* 2.6* 2.6* 2.9*   Recent Labs  Lab 05/13/19 2325  LIPASE 50   No results for input(s): AMMONIA in the last 168 hours. CBC: Recent  Labs  Lab 05/13/19 2325  05/14/19 0540 05/15/19 0405 05/16/19 0645  WBC 10.6* 9.1 6.3 6.5  HGB 14.1 12.4* 9.8* 9.7*  HCT 42.9 38.3* 30.7* 30.2*  MCV 92.3 93.2 94.8 95.0  PLT 394 274 208 191   Cardiac Enzymes: Recent Labs  Lab 05/13/19 2325  CKTOTAL 266   BNP: Invalid input(s): POCBNP CBG: Recent Labs  Lab 05/16/19 0803 05/16/19 1156 05/16/19 1550 05/16/19 2352 05/17/19 0740  GLUCAP 79 91 80 72 91    Time coordinating discharge:  36 minutes  Signed:  Catarina Hartshorn, DO [facility] Pager: [phone] 05/17/2019, 11:19 AM

## 2019-05-17 NOTE — Progress Notes (Signed)
Nsg Discharge Note  Admit Date:  05/13/2019 Discharge date: 05/17/2019   Knolan Simien Felten to be D/C'd Home per MD order.  AVS completed.  Copy for chart, and copy for patient signed, and dated. Patient/caregiver able to verbalize understanding.  Discharge Medication: Allergies as of 05/17/2019   No Known Allergies     Medication List    STOP taking these medications   labetalol 200 MG tablet Commonly known as: NORMODYNE     TAKE these medications   allopurinol 300 MG tablet Commonly known as: ZYLOPRIM Take 300 mg by mouth daily.   aspirin EC 81 MG tablet Take 81 mg by mouth daily.   chlorthalidone 25 MG tablet Commonly known as: HYGROTON Take 1 tablet (25 mg total) by mouth daily. Start taking on: May 18, 2019   cloNIDine 0.2 MG tablet Commonly known as: CATAPRES Take 1 tablet (0.2 mg total) by mouth 2 (two) times daily. What changed:   medication strength  how much to take   fenofibrate 145 MG tablet Commonly known as: TRICOR Take 145 mg by mouth daily.   Lomotil 2.5-0.025 MG tablet Generic drug: diphenoxylate-atropine Take 1 tablet by mouth daily.   Mestinon 60 MG tablet Generic drug: pyridostigmine Take 30 mg by mouth 3 (three) times daily.   metoprolol 200 MG 24 hr tablet Commonly known as: TOPROL-XL Take 1 tablet (200 mg total) by mouth daily. Take with or immediately following a meal. What changed:   medication strength  how much to take   multivitamin with minerals Tabs tablet Take 1 tablet by mouth daily.   spironolactone 25 MG tablet Commonly known as: ALDACTONE Take 1 tablet (25 mg total) by mouth daily.   traZODone 100 MG tablet Commonly known as: DESYREL Take 100 mg by mouth at bedtime.       Discharge Assessment: Vitals:   05/17/19 0626 05/17/19 1030  BP: (!) 196/103 (!) 180/103  Pulse: 70 70  Resp: 18 16  Temp: 99.5 F (37.5 C) 99.2 F (37.3 C)  SpO2: 98% 99%   Skin clean, dry and intact without evidence of  skin break down, no evidence of skin tears noted. IV catheter discontinued intact. Site without signs and symptoms of complications - no redness or edema noted at insertion site, patient denies c/o pain - only slight tenderness at site.  Dressing with slight pressure applied.  D/c Instructions-Education: Discharge instructions given to patient/family with verbalized understanding. D/c education completed with patient/family including follow up instructions, medication list, d/c activities limitations if indicated, with other d/c instructions as indicated by MD - patient able to verbalize understanding, all questions fully answered. Patient instructed to return to ED, call 911, or call MD for any changes in condition.  Patient escorted via WC, and D/C home via private auto.  Carole Civil, RN 05/17/2019 2:48 PM

## 2019-05-18 ENCOUNTER — Encounter (HOSPITAL_COMMUNITY): Payer: Self-pay | Admitting: Emergency Medicine

## 2019-05-18 ENCOUNTER — Other Ambulatory Visit: Payer: Self-pay

## 2019-05-18 ENCOUNTER — Emergency Department (HOSPITAL_COMMUNITY)
Admission: EM | Admit: 2019-05-18 | Discharge: 2019-05-18 | Disposition: A | Discharge status: Home / Self Care | Payer: BC Managed Care – PPO | Attending: Emergency Medicine | Admitting: Emergency Medicine

## 2019-05-18 ENCOUNTER — Emergency Department (HOSPITAL_COMMUNITY): Payer: BC Managed Care – PPO

## 2019-05-18 DIAGNOSIS — Z23 Encounter for immunization: Secondary | ICD-10-CM | POA: Diagnosis not present

## 2019-05-18 DIAGNOSIS — S80212D Abrasion, left knee, subsequent encounter: Secondary | ICD-10-CM | POA: Diagnosis not present

## 2019-05-18 DIAGNOSIS — X58XXXD Exposure to other specified factors, subsequent encounter: Secondary | ICD-10-CM | POA: Diagnosis not present

## 2019-05-18 DIAGNOSIS — Z79899 Other long term (current) drug therapy: Secondary | ICD-10-CM | POA: Diagnosis not present

## 2019-05-18 DIAGNOSIS — S80211D Abrasion, right knee, subsequent encounter: Secondary | ICD-10-CM | POA: Diagnosis not present

## 2019-05-18 DIAGNOSIS — T07XXXA Unspecified multiple injuries, initial encounter: Secondary | ICD-10-CM

## 2019-05-18 DIAGNOSIS — Z7982 Long term (current) use of aspirin: Secondary | ICD-10-CM | POA: Insufficient documentation

## 2019-05-18 DIAGNOSIS — I10 Essential (primary) hypertension: Secondary | ICD-10-CM | POA: Diagnosis not present

## 2019-05-18 MED ORDER — CELECOXIB 100 MG PO CAPS: 100.0000 mg | ORAL_CAPSULE | Freq: Two times a day (BID) | ORAL | 0 refills | Status: AC

## 2019-05-18 MED ORDER — TETANUS-DIPHTH-ACELL PERTUSSIS 5-2.5-18.5 LF-MCG/0.5 IM SUSP
0.5000 mL | Freq: Once | INTRAMUSCULAR | Status: AC
  Administered 2019-05-18: 0.5 mL via INTRAMUSCULAR
  Filled 2019-05-18: qty 0.5

## 2019-05-18 NOTE — ED Notes (Signed)
Fell yesterday   Bilateral knee pain since then  Here for eval  Difficult ambulation   In rad

## 2019-05-18 NOTE — ED Triage Notes (Signed)
Patient complains of bilateral knee pain from a fall that occurred yesterday afternoon.

## 2019-05-18 NOTE — ED Notes (Signed)
PA in to assess 

## 2019-05-18 NOTE — Discharge Instructions (Addendum)
Apply ice to the knees over a towel for 20 minutes 5 times a day. Take the medication I have prescribed for pain. Apply neosporin to the wounds 2 times a day for 5 days. Follow up with your primary care doctor.  Return for any new or worsening symptoms.

## 2019-05-18 NOTE — ED Provider Notes (Signed)
[facility] EMERGENCY DEPARTMENT Provider Note   CSN: [id number] Arrival date & time: 05/18/19  1245     History Chief Complaint  Patient presents with  . Fall    Gabriel Zavala is a 54 y.o. male who presents with BL knee pain after fall. Patient states that the injury occurred yesterday however the wounds appear older than that.  Patient states that he fell and skinned his knees.  He has been able to ambulate.  He states that he took aspirin for pain but it was not enough.  He denies a history of chronic knee pain although review of EMR shows that he had bilateral knee x-rays while hospitalized 2 days ago.  He denies hitting his head or losing consciousness.  The patient is a very poor historian which limits the HPI  HPI     Past Medical History:  Diagnosis Date  . Gout   . History of nuclear stress test 06/2017   "No evidence of pharmacologically induced myocardial ischemia or prior MI"  . Hypercholesteremia   . Hypertension   . Seizure (HCC) 10/16/2018    Patient Active Problem List   Diagnosis Date Noted  . Non-intractable vomiting   . Alcohol dependence with uncomplicated withdrawal (HCC) 05/15/2019  . Acute pain of right knee   . Metabolic acidosis 05/14/2019  . Lactic acidosis 05/14/2019  . Hypertensive urgency 05/14/2019  . Hypomagnesemia 05/14/2019  . Alcohol use 05/14/2019  . Impaired glucose tolerance 05/14/2019  . Hypercholesteremia   . Gout   . Alcohol abuse   . Dehydration   . Seizure (HCC) 10/16/2018  . AKI (acute kidney injury) (HCC) 10/16/2018  . Hypokalemia 10/16/2018  . Elevated bilirubin 10/16/2018  . Leukocytosis 10/16/2018  . TIA (transient ischemic attack) 10/16/2018  . Hyperglycemia 10/16/2018    Past Surgical History:  Procedure Laterality Date  . HERNIA REPAIR         Family History  Problem Relation Age of Onset  . Hypertension Mother   . Hypertension Sister   . Hypertension Brother   . Seizures Brother    Secondary to TBI  . Hypertension Sister     Social History   Tobacco Use  . Smoking status: Never Smoker  . Smokeless tobacco: Never Used  Substance Use Topics  . Alcohol use: Yes    Alcohol/week: 10.0 standard drinks    Types: 5 Cans of beer, 5 Glasses of wine per week    Comment: occ  . Drug use: Not Currently    Home Medications Prior to Admission medications   Medication Sig Start Date End Date Taking? Authorizing Provider  allopurinol (ZYLOPRIM) 300 MG tablet Take 300 mg by mouth daily.     [provider]  aspirin EC 81 MG tablet Take 81 mg by mouth daily.    [provider]  celecoxib (CELEBREX) 100 MG capsule Take 1 capsule (100 mg total) by mouth 2 (two) times daily. 05/18/19   Arthor Captain, PA-C  chlorthalidone (HYGROTON) 25 MG tablet Take 1 tablet (25 mg total) by mouth daily. 05/18/19   Catarina Hartshorn, MD  cloNIDine (CATAPRES) 0.2 MG tablet Take 1 tablet (0.2 mg total) by mouth 2 (two) times daily. 05/17/19   Catarina Hartshorn, MD  diphenoxylate-atropine (LOMOTIL) 2.5-0.025 MG tablet Take 1 tablet by mouth daily.    [provider]  fenofibrate (TRICOR) 145 MG tablet Take 145 mg by mouth daily.     [provider]  MESTINON 60 MG tablet Take 30  mg by mouth 3 (three) times daily. 05/06/19   [provider]  metoprolol succinate (TOPROL-XL) 200 MG 24 hr tablet Take 1 tablet (200 mg total) by mouth daily. Take with or immediately following a meal. 05/17/19   Tat, Onalee Hua, MD  Multiple Vitamin (MULTIVITAMIN WITH MINERALS) TABS tablet Take 1 tablet by mouth daily.    [provider]  spironolactone (ALDACTONE) 25 MG tablet Take 1 tablet (25 mg total) by mouth daily. 05/17/19   Catarina Hartshorn, MD  traZODone (DESYREL) 100 MG tablet Take 100 mg by mouth at bedtime. 12/20/18   [provider]    Allergies    Patient has no known allergies.  Review of Systems   Review of Systems Ten systems reviewed and are negative for acute change,  except as noted in the HPI.   Physical Exam Updated Vital Signs BP (!) 150/100 (BP Location: Right Arm)   Pulse (!) 101   Temp 98.6 F (37 C) (Oral)   Resp 16   Ht 5\' 8"  (1.727 m)   Wt 99 kg   SpO2 99%   BMI 33.19 kg/m   Physical Exam Vitals and nursing note reviewed.  Constitutional:      General: He is not in acute distress.    Appearance: He is well-developed. He is not diaphoretic.  HENT:     Head: Normocephalic and atraumatic.  Eyes:     General: No scleral icterus.    Conjunctiva/sclera: Conjunctivae normal.  Cardiovascular:     Rate and Rhythm: Normal rate and regular rhythm.     Heart sounds: Normal heart sounds.  Pulmonary:     Effort: Pulmonary effort is normal. No respiratory distress.     Breath sounds: Normal breath sounds.  Abdominal:     Palpations: Abdomen is soft.     Tenderness: There is no abdominal tenderness.  Musculoskeletal:     Cervical back: Normal range of motion and neck supple.     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  There are abrasions over both of the tibial tuberosities which are well scabbed and appear older than 1 day.  Full range of motion bilateral knees.  Tenderness over the area of abrasion.  Ligaments stable.  Skin:    General: Skin is warm and dry.  Neurological:     Mental Status: He is alert.  Psychiatric:        Behavior: Behavior normal.     ED Results / Procedures / Treatments   Labs (all labs ordered are listed, but only abnormal results are displayed) Labs Reviewed - No data to display  EKG None  Radiology DG Knee Complete 4 Views Left  Result Date: 05/18/2019 CLINICAL DATA:  Fall yesterday with bilateral knee pain. EXAM: LEFT KNEE - COMPLETE 4+ VIEW COMPARISON:  None. FINDINGS: No evidence of fracture, dislocation, or joint effusion. No evidence of arthropathy or other focal bone abnormality. Soft tissues are unremarkable. IMPRESSION: Negative. Electronically  Signed   By: 05/20/2019 M.D.   On: 05/18/2019 13:17   DG Knee Complete 4 Views Right  Result Date: 05/18/2019 CLINICAL DATA:  Fall yesterday with bilateral knee pain. EXAM: RIGHT KNEE - COMPLETE 4+ VIEW COMPARISON:  None. FINDINGS: No evidence of fracture, dislocation, or joint effusion. No evidence of arthropathy or other focal bone abnormality. Soft tissues are unremarkable. IMPRESSION: Negative. Electronically Signed   By: 05/20/2019 M.D.   On: 05/18/2019 13:16    Procedures Procedures (including critical care time)  Medications Ordered in ED Medications  Tdap (BOOSTRIX) injection 0.5 mL (0.5 mLs Intramuscular Given 05/18/19 1340)    ED Course  I have reviewed the triage vital signs and the nursing notes.  Pertinent labs & imaging results that were available during my care of the patient were reviewed by me and considered in my medical decision making (see chart for details).    MDM Rules/Calculators/A&P                      Patient with injury.  X-rays are negative for acute abnormality.  Patient has bilateral abrasions.  Tetanus vaccination updated.  No evidence of tendon disruption.  Advised  ice and Neosporin.  Patient appears appropriate for discharge at this time.  Final Clinical Impression(s) / ED Diagnoses Final diagnoses:  Multiple abrasions    Rx / DC Orders ED Discharge Orders         Ordered    celecoxib (CELEBREX) 100 MG capsule  2 times daily     05/18/19 1335           Margarita Mail, PA-C 05/18/19 1341    Sherwood Gambler, MD 05/19/19 [phone]

## 2019-05-19 LAB — GI PATHOGEN PANEL BY PCR, STOOL

## 2019-05-30 ENCOUNTER — Other Ambulatory Visit: Payer: Self-pay

## 2019-05-30 ENCOUNTER — Emergency Department (HOSPITAL_COMMUNITY)
Admission: EM | Admit: 2019-05-30 | Discharge: 2019-05-31 | Disposition: A | Discharge status: Home / Self Care | Payer: BC Managed Care – PPO | Attending: Emergency Medicine | Admitting: Emergency Medicine

## 2019-05-30 ENCOUNTER — Encounter (HOSPITAL_COMMUNITY): Payer: Self-pay | Admitting: *Deleted

## 2019-05-30 DIAGNOSIS — F1010 Alcohol abuse, uncomplicated: Secondary | ICD-10-CM | POA: Diagnosis not present

## 2019-05-30 DIAGNOSIS — Z20822 Contact with and (suspected) exposure to covid-19: Secondary | ICD-10-CM | POA: Diagnosis not present

## 2019-05-30 DIAGNOSIS — Z7982 Long term (current) use of aspirin: Secondary | ICD-10-CM | POA: Diagnosis not present

## 2019-05-30 DIAGNOSIS — Z79899 Other long term (current) drug therapy: Secondary | ICD-10-CM | POA: Insufficient documentation

## 2019-05-30 DIAGNOSIS — F332 Major depressive disorder, recurrent severe without psychotic features: Secondary | ICD-10-CM | POA: Diagnosis not present

## 2019-05-30 DIAGNOSIS — I10 Essential (primary) hypertension: Secondary | ICD-10-CM | POA: Diagnosis not present

## 2019-05-30 DIAGNOSIS — R45851 Suicidal ideations: Secondary | ICD-10-CM | POA: Insufficient documentation

## 2019-05-30 DIAGNOSIS — F10929 Alcohol use, unspecified with intoxication, unspecified: Secondary | ICD-10-CM | POA: Diagnosis present

## 2019-05-30 DIAGNOSIS — E876 Hypokalemia: Secondary | ICD-10-CM | POA: Insufficient documentation

## 2019-05-30 LAB — RAPID URINE DRUG SCREEN, HOSP PERFORMED
Amphetamines: NOT DETECTED
Barbiturates: NOT DETECTED
Benzodiazepines: NOT DETECTED
Cocaine: NOT DETECTED
Opiates: NOT DETECTED
Tetrahydrocannabinol: NOT DETECTED

## 2019-05-30 LAB — COMPREHENSIVE METABOLIC PANEL
ALT: 76 U/L — ABNORMAL HIGH (ref 0–44)
AST: 188 U/L — ABNORMAL HIGH (ref 15–41)
Albumin: 3.9 g/dL (ref 3.5–5.0)
Alkaline Phosphatase: 110 U/L (ref 38–126)
Anion gap: 12 (ref 5–15)
BUN: 9 mg/dL (ref 6–20)
CO2: 24 mmol/L (ref 22–32)
Calcium: 9.4 mg/dL (ref 8.9–10.3)
Chloride: 101 mmol/L (ref 98–111)
Creatinine, Ser: 1.25 mg/dL — ABNORMAL HIGH (ref 0.61–1.24)
GFR calc Af Amer: >60 mL/min (ref >60)
GFR calc non Af Amer: >60 mL/min (ref >60)
Glucose, Bld: 107 mg/dL — ABNORMAL HIGH (ref 70–99)
Potassium: 2.9 mmol/L — ABNORMAL LOW (ref 3.5–5.1)
Sodium: 137 mmol/L (ref 135–145)
Total Bilirubin: 0.8 mg/dL (ref 0.3–1.2)
Total Protein: 8 g/dL (ref 6.5–8.1)

## 2019-05-30 LAB — CBC
HCT: 37.2 % — ABNORMAL LOW (ref 39.0–52.0)
Hemoglobin: 12 g/dL — ABNORMAL LOW (ref 13.0–17.0)
MCH: 29.8 pg (ref 26.0–34.0)
MCHC: 32.3 g/dL (ref 30.0–36.0)
MCV: 92.3 fL (ref 80.0–100.0)
Platelets: 393 10*3/uL (ref 150–400)
RBC: 4.03 MIL/uL — ABNORMAL LOW (ref 4.22–5.81)
RDW: 15 % (ref 11.5–15.5)
WBC: 9.3 10*3/uL (ref 4.0–10.5)
nRBC: 0 % (ref 0.0–0.2)

## 2019-05-30 LAB — RESPIRATORY PANEL BY RT PCR (FLU A&B, COVID)
Influenza A by PCR: NEGATIVE
Influenza B by PCR: NEGATIVE
SARS Coronavirus 2 by RT PCR: NEGATIVE

## 2019-05-30 LAB — ACETAMINOPHEN LEVEL: Acetaminophen (Tylenol), Serum: <10 ug/mL — ABNORMAL LOW (ref 10–30)

## 2019-05-30 LAB — ETHANOL: Alcohol, Ethyl (B): 322 mg/dL (ref <10)

## 2019-05-30 LAB — SALICYLATE LEVEL: Salicylate Lvl: <7 mg/dL — ABNORMAL LOW (ref 7.0–30.0)

## 2019-05-30 MED ORDER — CLONIDINE HCL 0.2 MG PO TABS
0.2000 mg | ORAL_TABLET | Freq: Two times a day (BID) | ORAL | Status: DC
  Administered 2019-05-30 – 2019-05-31 (×2): 0.2 mg via ORAL
  Filled 2019-05-30 (×2): qty 1

## 2019-05-30 MED ORDER — ACETAMINOPHEN 325 MG PO TABS
650.0000 mg | ORAL_TABLET | Freq: Once | ORAL | Status: AC
  Administered 2019-05-30: 650 mg via ORAL
  Filled 2019-05-30: qty 2

## 2019-05-30 MED ORDER — TRAZODONE HCL 50 MG PO TABS
100.0000 mg | ORAL_TABLET | Freq: Every day | ORAL | Status: DC
  Administered 2019-05-30: 22:00:00 100 mg via ORAL
  Filled 2019-05-30: qty 2

## 2019-05-30 MED ORDER — ASPIRIN EC 81 MG PO TBEC
81.0000 mg | DELAYED_RELEASE_TABLET | Freq: Every day | ORAL | Status: DC
  Administered 2019-05-30 – 2019-05-31 (×2): 81 mg via ORAL
  Filled 2019-05-30 (×2): qty 1

## 2019-05-30 MED ORDER — LORAZEPAM 1 MG PO TABS: 0.0000 mg | ORAL_TABLET | Freq: Four times a day (QID) | ORAL | Status: DC

## 2019-05-30 MED ORDER — DICLOFENAC SODIUM 1 % EX GEL
2.0000 g | Freq: Once | CUTANEOUS | Status: AC
  Administered 2019-05-30: 2 g via TOPICAL
  Filled 2019-05-30 (×2): qty 100

## 2019-05-30 MED ORDER — LORAZEPAM 1 MG PO TABS: 0.0000 mg | ORAL_TABLET | Freq: Two times a day (BID) | ORAL | Status: DC

## 2019-05-30 MED ORDER — CELECOXIB 100 MG PO CAPS
100.0000 mg | ORAL_CAPSULE | Freq: Two times a day (BID) | ORAL | Status: DC
  Administered 2019-05-30 – 2019-05-31 (×2): 100 mg via ORAL
  Filled 2019-05-30: qty 1

## 2019-05-30 MED ORDER — THIAMINE HCL 100 MG/ML IJ SOLN
100.0000 mg | Freq: Every day | INTRAMUSCULAR | Status: DC
  Filled 2019-05-30: qty 2

## 2019-05-30 MED ORDER — THIAMINE HCL 100 MG PO TABS
100.0000 mg | ORAL_TABLET | Freq: Every day | ORAL | Status: DC
  Administered 2019-05-30 – 2019-05-31 (×2): 100 mg via ORAL
  Filled 2019-05-30 (×2): qty 1

## 2019-05-30 MED ORDER — POTASSIUM CHLORIDE CRYS ER 20 MEQ PO TBCR
60.0000 meq | EXTENDED_RELEASE_TABLET | Freq: Once | ORAL | Status: AC
  Administered 2019-05-30: 60 meq via ORAL
  Filled 2019-05-30: qty 3

## 2019-05-30 MED ORDER — LORAZEPAM 2 MG/ML IJ SOLN: 0.0000 mg | Freq: Two times a day (BID) | INTRAMUSCULAR | Status: DC

## 2019-05-30 MED ORDER — METOPROLOL SUCCINATE ER 25 MG PO TB24
200.0000 mg | ORAL_TABLET | Freq: Every day | ORAL | Status: DC
  Administered 2019-05-30 – 2019-05-31 (×2): 200 mg via ORAL
  Filled 2019-05-30 (×2): qty 8

## 2019-05-30 MED ORDER — SPIRONOLACTONE 25 MG PO TABS
25.0000 mg | ORAL_TABLET | Freq: Every day | ORAL | Status: DC
  Administered 2019-05-30 – 2019-05-31 (×2): 25 mg via ORAL
  Filled 2019-05-30 (×5): qty 1

## 2019-05-30 MED ORDER — CHLORTHALIDONE 25 MG PO TABS
25.0000 mg | ORAL_TABLET | Freq: Every day | ORAL | Status: DC
  Administered 2019-05-30 – 2019-05-31 (×2): 25 mg via ORAL
  Filled 2019-05-30 (×5): qty 1

## 2019-05-30 MED ORDER — LORAZEPAM 2 MG/ML IJ SOLN: 0.0000 mg | Freq: Four times a day (QID) | INTRAMUSCULAR | Status: DC

## 2019-05-30 MED ORDER — SODIUM CHLORIDE 0.9 % IV BOLUS
1000.0000 mL | Freq: Once | INTRAVENOUS | Status: AC
  Administered 2019-05-30: 14:00:00 1000 mL via INTRAVENOUS

## 2019-05-30 MED ORDER — PYRIDOSTIGMINE BROMIDE 60 MG PO TABS
30.0000 mg | ORAL_TABLET | Freq: Three times a day (TID) | ORAL | Status: DC
  Administered 2019-05-30 – 2019-05-31 (×2): 30 mg via ORAL
  Filled 2019-05-30 (×8): qty 0.5

## 2019-05-30 NOTE — BH Assessment (Addendum)
Assessment Note  Gabriel Zavala is an 54 y.o. male that presents this date requesting assistance with ongoing alcohol abuse. Patient denies any S/I at the time of assessment although per notes on admission had reported he was having thoughts of self harm. Patient states at the time of assessment that he does not recall making any statements of self harm although reports he "gets very emotional when he drinks." Patient's memory is recent impaired and does not recall how he arrived at the hospital. Patient states the last thing he remembers is getting off work this morning at 0700 hours from [place] in [place] and reports he "just started drinking sometime after that." Patient was last seen on 05/14/18 when he presented at that time with similar symptoms and was observed overnight for safety. Patient was recommended to follow up with OP services that were provided on discharge although patient states "he just forgot about it." Patient reports he was diagnosed with depression "a few years ago" although denies any current OP providers or current medication interventions. Patient denies any current symptoms and states "I just drink because I am a alcoholic." Per history review patient has no prior attempts at self harm. Patient reports daily use of alcohol to include "a few beers everyday" and up to 1 pint of vodka three to four times a week with last use prior to arrival when he reports he drank a unknown amount. Patient's BAL was noted to be 322 on arrival and UDS negative. Patient denies the use of any other illicit substances. Per notes this date  on  from his   Patient denies any H/I or AVH. Patient denies any prior attempts or gestures at self harm. Patient denies having any current OP provider or prescribed any medications for any mental health disorder. Patient renders a conflicting history as evidenced per note review. On arrival PA notes that patient presents with "complaints of suicidal ideations  for past several days. Patient states he has been alone a lot lately, been upset/depressed, and having thoughts of committing suicide. No specific plan, no recent attempts made, but does state he tried to commit suicide once a long time ago. Other than being by himself no alleviating/aggravating factors. He reports continued alcohol use, close to daily, unsure how much specifically, states last drink was around 7 or 8 AM today. Denies fever, chills, nausea, vomiting, abdominal pain, chest pain, dyspnea, or seizures. Denies homicidal ideation or hallucinations. Denies drug use". In triage RN notes on arrival. "Pt arrives via [facility], they were called out by pt and then he could not remember calling them. Once they arrived he told them he "just wanted to die" and then he wants help with alcohol abuse. Per ems there were numerous empty alcohol containers in the room and the pt was lying in bed naked in urine and feces. On arrival pt is tearful, alternately stating he wants to die, and then he wants help with his alcohol abuse. Pt states "if I get out of here in a few days I am going to kill myself." Patient is oriented x 4 and presents with a pleasant affect. Patient's memory is recent impaired although does not appear to be responding to internal stimuli. Patient's concentration was poor and thoughts were disorganized. Patient is requesting assistance with ongoing alcohol issues. Due to conflicting history on arrival patient be be observed and monitored. [facility] NP recommends overnight observation.         Diagnosis: F33.2 MDD recurrent without psychotic  features, severe, Alcohol abuse   Past Medical History:  Past Medical History:  Diagnosis Date  . Gout   . History of nuclear stress test 06/2017   "No evidence of pharmacologically induced myocardial ischemia or prior MI"  . Hypercholesteremia   . Hypertension   . Seizure (HCC) 10/16/2018    Past Surgical History:  Procedure Laterality Date  . HERNIA  REPAIR      Family History:  Family History  Problem Relation Age of Onset  . Hypertension Mother   . Hypertension Sister   . Hypertension Brother   . Seizures Brother        Secondary to TBI  . Hypertension Sister     Social History:  reports that he has never smoked. He has never used smokeless tobacco. He reports current alcohol use of about 10.0 standard drinks of alcohol per week. He reports previous drug use.  Additional Social History:  Alcohol / Drug Use Pain Medications: See MAR Prescriptions: See MAR Over the Counter: See MAR History of alcohol / drug use?: Yes Longest period of sobriety (when/how long): Unknown Negative Consequences of Use: Personal relationships Withdrawal Symptoms: (denies) Substance #1 Name of Substance 1: Alcohol 1 - Age of First Use: 15 1 - Amount (size/oz): Varies 1 - Frequency: Varies 1 - Duration: Ongoing 1 - Last Use / Amount: 05/29/19 Pt states he drank a unknown amount  CIWA: CIWA-Ar BP: (!) 154/88 Pulse Rate: 89 Nausea and Vomiting: no nausea and no vomiting Tactile Disturbances: none Tremor: no tremor Auditory Disturbances: not present Paroxysmal Sweats: no sweat visible Visual Disturbances: not present Anxiety: mildly anxious Headache, Fullness in Head: none present Agitation: normal activity Orientation and Clouding of Sensorium: cannot do serial additions or is uncertain about date CIWA-Ar Total: 2 COWS:    Allergies: No Known Allergies  Home Medications: (Not in a hospital admission)   OB/GYN Status:  No LMP for male patient.  General Assessment Data Location of Assessment: [facility] TTS Assessment: In system Is this a Tele or Face-to-Face Assessment?: Tele Assessment Is this an Initial Assessment or a Re-assessment for this encounter?: Initial Assessment Patient Accompanied by:: N/A Language Other than English: No Living Arrangements: Other (Comment) What gender do you identify as?: Male Marital status:  Single Living Arrangements: Alone Can pt return to current living arrangement?: Yes Admission Status: Voluntary Is patient capable of signing voluntary admission?: Yes Referral Source: Self/Family/Friend Insurance type: BCBS     Crisis Care Plan Living Arrangements: Alone Legal Guardian: (Self) Name of Psychiatrist: None Name of Therapist: none  Education Status Is patient currently in school?: No Is the patient employed, unemployed or receiving disability?: Employed  Risk to self with the past 6 months Suicidal Ideation: No Has patient been a risk to self within the past 6 months prior to admission? : No Suicidal Intent: No Has patient had any suicidal intent within the past 6 months prior to admission? : No Is patient at risk for suicide?: No, but patient needs Medical Clearance Suicidal Plan?: No Has patient had any suicidal plan within the past 6 months prior to admission? : No Specify Current Suicidal Plan: NA Access to Means: No Specify Access to Suicidal Means: NA What has been your use of drugs/alcohol within the last 12 months?: Current use Previous Attempts/Gestures: No How many times?: 0 Other Self Harm Risks: (Excessive ETOH use) Triggers for Past Attempts: (NA) Intentional Self Injurious Behavior: None Family Suicide History: No Recent stressful life event(s): Other (Comment)(Excessive SA  use) Persecutory voices/beliefs?: No Depression: No Depression Symptoms: (Pt denies) Substance abuse history and/or treatment for substance abuse?: Yes Suicide prevention information given to non-admitted patients: Not applicable  Risk to Others within the past 6 months Homicidal Ideation: No Does patient have any lifetime risk of violence toward others beyond the six months prior to admission? : No Thoughts of Harm to Others: No Current Homicidal Intent: No Current Homicidal Plan: No Access to Homicidal Means: No Identified Victim: NA History of harm to others?:  No Assessment of Violence: None Noted Violent Behavior Description: None noted Does patient have access to weapons?: No Criminal Charges Pending?: No Does patient have a court date: No Is patient on probation?: No  Psychosis Hallucinations: None noted Delusions: None noted  Mental Status Report Appearance/Hygiene: In scrubs Eye Contact: Fair Motor Activity: Freedom of movement Speech: Logical/coherent Level of Consciousness: Quiet/awake Mood: Pleasant Affect: Appropriate to circumstance Anxiety Level: None Thought Processes: Coherent, Relevant Judgement: Partial Orientation: Person, Place, Time Obsessive Compulsive Thoughts/Behaviors: None  Cognitive Functioning Concentration: Normal Memory: Recent Impaired Is patient IDD: No Insight: Fair Impulse Control: Poor Appetite: Good Have you had any weight changes? : No Change Sleep: No Change Total Hours of Sleep: 7 Vegetative Symptoms: None  ADLScreening [facility] Assessment Services) Patient's cognitive ability adequate to safely complete daily activities?: Yes Patient able to express need for assistance with ADLs?: Yes Independently performs ADLs?: Yes (appropriate for developmental age)  Prior Inpatient Therapy Prior Inpatient Therapy: No  Prior Outpatient Therapy Prior Outpatient Therapy: No Does patient have an ACCT team?: No Does patient have Intensive In-House Services?  : No Does patient have [facility] services? : No Does patient have P4CC services?: No  ADL Screening (condition at time of admission) Patient's cognitive ability adequate to safely complete daily activities?: Yes Is the patient deaf or have difficulty hearing?: No Does the patient have difficulty seeing, even when wearing glasses/contacts?: No Does the patient have difficulty concentrating, remembering, or making decisions?: No Patient able to express need for assistance with ADLs?: Yes Does the patient have difficulty dressing or bathing?:  No Independently performs ADLs?: Yes (appropriate for developmental age) Does the patient have difficulty walking or climbing stairs?: No Weakness of Legs: None Weakness of Arms/Hands: None  Home Assistive Devices/Equipment Home Assistive Devices/Equipment: None  Therapy Consults (therapy consults require a physician order) PT Evaluation Needed: No OT Evalulation Needed: No SLP Evaluation Needed: No Abuse/Neglect Assessment (Assessment to be complete while patient is alone) Physical Abuse: Denies Verbal Abuse: Denies Sexual Abuse: Denies Exploitation of patient/patient's resources: Denies Self-Neglect: Denies Values / Beliefs Cultural Requests During Hospitalization: None Spiritual Requests During Hospitalization: None Consults Spiritual Care Consult Needed: No Transition of Care Team Consult Needed: No Advance Directives (For Healthcare) Does Patient Have a Medical Advance Directive?: No Would patient like information on creating a medical advance directive?: No - Patient declined          Disposition: [facility] NP recommends overnight observation.       Disposition Initial Assessment Completed for this Encounter: Yes  On Site Evaluation by:   Reviewed with Physician:    Alfredia Ferguson 05/30/2019 3:38 PM

## 2019-05-30 NOTE — ED Provider Notes (Signed)
[facility] EMERGENCY DEPARTMENT Provider Note   CSN: [id number] Arrival date & time: 05/30/19  1246     History Chief Complaint  Patient presents with  . Alcohol Intoxication  . Suicidal    Gabriel Zavala is a 54 y.o. male with a history of hypertension, hypercholesterolemia, seizures, & EtOH abuse with prior withdrawal who presents to the ED with complaints of suicidal ideations for past several days. Patient states he has been alone a lot lately, been upset/depressed, and having thoughts of committing suicide. No specific plan, no recent attempts made, but does state he tried to commit suicide once a long time ago. Other than being by himself no alleviating/aggravating factors. He reports continued alcohol use, close to daily, unsure how much specifically, states last drink was around 7 or 8AM today. Denies fever, chills, nausea, vomiting, abdominal pain, chest pain, dyspnea, or seizures.  Denies homicidal ideation or hallucinations.  Denies drug use.  HPI     Past Medical History:  Diagnosis Date  . Gout   . History of nuclear stress test 06/2017   "No evidence of pharmacologically induced myocardial ischemia or prior MI"  . Hypercholesteremia   . Hypertension   . Seizure (HCC) 10/16/2018    Patient Active Problem List   Diagnosis Date Noted  . Non-intractable vomiting   . Alcohol dependence with uncomplicated withdrawal (HCC) 05/15/2019  . Acute pain of right knee   . Metabolic acidosis 05/14/2019  . Lactic acidosis 05/14/2019  . Hypertensive urgency 05/14/2019  . Hypomagnesemia 05/14/2019  . Alcohol use 05/14/2019  . Impaired glucose tolerance 05/14/2019  . Hypercholesteremia   . Gout   . Alcohol abuse   . Dehydration   . Seizure (HCC) 10/16/2018  . AKI (acute kidney injury) (HCC) 10/16/2018  . Hypokalemia 10/16/2018  . Elevated bilirubin 10/16/2018  . Leukocytosis 10/16/2018  . TIA (transient ischemic attack) 10/16/2018  . Hyperglycemia 10/16/2018     Past Surgical History:  Procedure Laterality Date  . HERNIA REPAIR         Family History  Problem Relation Age of Onset  . Hypertension Mother   . Hypertension Sister   . Hypertension Brother   . Seizures Brother        Secondary to TBI  . Hypertension Sister     Social History   Tobacco Use  . Smoking status: Never Smoker  . Smokeless tobacco: Never Used  Substance Use Topics  . Alcohol use: Yes    Alcohol/week: 10.0 standard drinks    Types: 5 Glasses of wine, 5 Cans of beer per week    Comment: pt states he drinks everyday but doesn't know how much  . Drug use: Not Currently    Home Medications Prior to Admission medications   Medication Sig Start Date End Date Taking? Authorizing Provider  allopurinol (ZYLOPRIM) 300 MG tablet Take 300 mg by mouth daily.     [provider]  aspirin EC 81 MG tablet Take 81 mg by mouth daily.    [provider]  celecoxib (CELEBREX) 100 MG capsule Take 1 capsule (100 mg total) by mouth 2 (two) times daily. 05/18/19   Arthor Captain, PA-C  chlorthalidone (HYGROTON) 25 MG tablet Take 1 tablet (25 mg total) by mouth daily. 05/18/19   Catarina Hartshorn, MD  cloNIDine (CATAPRES) 0.2 MG tablet Take 1 tablet (0.2 mg total) by mouth 2 (two) times daily. 05/17/19   Catarina Hartshorn, MD  diphenoxylate-atropine (LOMOTIL) 2.5-0.025 MG tablet Take 1 tablet by  mouth daily.    [provider]  fenofibrate (TRICOR) 145 MG tablet Take 145 mg by mouth daily.     [provider]  MESTINON 60 MG tablet Take 30 mg by mouth 3 (three) times daily. 05/06/19   [provider]  metoprolol succinate (TOPROL-XL) 200 MG 24 hr tablet Take 1 tablet (200 mg total) by mouth daily. Take with or immediately following a meal. 05/17/19   Tat, Onalee Hua, MD  Multiple Vitamin (MULTIVITAMIN WITH MINERALS) TABS tablet Take 1 tablet by mouth daily.    [provider]  spironolactone (ALDACTONE) 25 MG tablet Take 1 tablet (25 mg total) by mouth  daily. 05/17/19   Catarina Hartshorn, MD  traZODone (DESYREL) 100 MG tablet Take 100 mg by mouth at bedtime. 12/20/18   [provider]    Allergies    Patient has no known allergies.  Review of Systems   Review of Systems  Constitutional: Negative for chills and fever.  Respiratory: Negative for shortness of breath.   Cardiovascular: Negative for chest pain.  Gastrointestinal: Negative for abdominal pain, diarrhea, nausea and vomiting.  Genitourinary: Negative for dysuria.  Neurological: Negative for seizures and syncope.  Psychiatric/Behavioral: Positive for suicidal ideas. Negative for hallucinations.       Negative for HI  All other systems reviewed and are negative.   Physical Exam Updated Vital Signs BP (!) 150/104 (BP Location: Left Arm)   Pulse 89   Temp 98.1 F (36.7 C) (Oral)   Resp 18   Ht 5\' 8"  (1.727 m)   Wt 99 kg   SpO2 100%   BMI 33.19 kg/m   Physical Exam Vitals and nursing note reviewed.  Constitutional:      General: He is not in acute distress.    Appearance: He is well-developed. He is not toxic-appearing.  HENT:     Head: Normocephalic and atraumatic.  Eyes:     General:        Right eye: No discharge.        Left eye: No discharge.     Conjunctiva/sclera: Conjunctivae normal.  Cardiovascular:     Rate and Rhythm: Normal rate and regular rhythm.  Pulmonary:     Effort: Pulmonary effort is normal. No respiratory distress.     Breath sounds: Normal breath sounds. No wheezing, rhonchi or rales.  Abdominal:     General: There is no distension.     Palpations: Abdomen is soft.     Tenderness: There is no abdominal tenderness.  Musculoskeletal:     Cervical back: Neck supple.  Skin:    General: Skin is warm and dry.     Findings: No rash.  Neurological:     Mental Status: He is alert.     Comments: Speech mildly slurred. No focal deficits.   Psychiatric:        Behavior: Behavior normal.     ED Results / Procedures / Treatments   Labs  (all labs ordered are listed, but only abnormal results are displayed) Labs Reviewed  COMPREHENSIVE METABOLIC PANEL - Abnormal; Notable for the following components:      Result Value   Potassium 2.9 (*)    Glucose, Bld 107 (*)    Creatinine, Ser 1.25 (*)    AST 188 (*)    ALT 76 (*)    All other components within normal limits  ETHANOL - Abnormal; Notable for the following components:   Alcohol, Ethyl (B) 322 (*)    All other  components within normal limits  CBC - Abnormal; Notable for the following components:   RBC 4.03 (*)    Hemoglobin 12.0 (*)    HCT 37.2 (*)    All other components within normal limits  SALICYLATE LEVEL - Abnormal; Notable for the following components:   Salicylate Lvl <9.4 (*)    All other components within normal limits  ACETAMINOPHEN LEVEL - Abnormal; Notable for the following components:   Acetaminophen (Tylenol), Serum <10 (*)    All other components within normal limits  RESPIRATORY PANEL BY RT PCR (FLU A&B, COVID)  RAPID URINE DRUG SCREEN, HOSP PERFORMED    EKG None  Radiology No results found.  Procedures Procedures (including critical care time)  Medications Ordered in ED Medications  LORazepam (ATIVAN) injection 0-4 mg (has no administration in time range)    Or  LORazepam (ATIVAN) tablet 0-4 mg (has no administration in time range)  LORazepam (ATIVAN) injection 0-4 mg (has no administration in time range)    Or  LORazepam (ATIVAN) tablet 0-4 mg (has no administration in time range)  thiamine tablet 100 mg (has no administration in time range)    Or  thiamine (B-1) injection 100 mg (has no administration in time range)  sodium chloride 0.9 % bolus 1,000 mL (has no administration in time range)    ED Course  I have reviewed the triage vital signs and the nursing notes.  Pertinent labs & imaging results that were available during my care of the patient were reviewed by me and considered in my medical decision making (see chart for  details).    MDM Rules/Calculators/A&P                      Patient presents to the ED with complaints of SI.  Admits to EtOH today, appears somewhat intoxicated clinically.  Vitals WNL with the exception of elevated BP- doubt HTN emergency.  Labs reviewed & compared to prior: Anemia improved. No leukocytosis. Mildly worsening creatinine- receiving fluids in the ED. Elevated AST/ALT in 2:1 fashion suspicion for etoh related, no abdominal pain/tenderness. Hypokalemia- oral replacement, QTc 497. Intoxicated with EtOH 322. On CIWA protocol. Patient medically cleared for TTS evaluation. Disposition per [facility].   Per [facility] observation overnight.  Home meds re-ordered.   Final Clinical Impression(s) / ED Diagnoses Final diagnoses:  Alcohol abuse  Suicidal ideation    Rx / DC Orders ED Discharge Orders    None       Leafy Kindle 05/30/19 2046    Elnora Morrison, MD 06/04/19 [phone]

## 2019-05-30 NOTE — ED Triage Notes (Signed)
Pt arrives via [facility], they were called out by pt and then he could not remember calling them. Once they arrived he told them he "just wanted to die" and then he wants help with alcohol abuse. Per ems there were numerous empty alcohol containers in the room and the pt was lying in bed naked in urine and feces. On arrival pt is tearful, alternately stating he wants to die, and then he wants help with his alcohol abuse. Pt states "if I get out of here in a few days I am going to kill myself"

## 2019-05-31 MED ORDER — POTASSIUM CHLORIDE CRYS ER 20 MEQ PO TBCR: 20.0000 meq | EXTENDED_RELEASE_TABLET | Freq: Every day | ORAL | 0 refills | Status: AC

## 2019-05-31 NOTE — BH Assessment (Signed)
[facility] Assessment Progress Note This Clinical research associate spoke to patient this date to assess current mental health state. Patient denies any S/I, H/I or AVH. Patient presents with appropriate affect and is requesting to be discharged. Patient was offered treatment to assist with ongoing alcohol issues although declined. Patient gave consent for this writer to contact his daughter Rocco Pauls [phone] to gather collateral information. Daughter states she has no safety concerns in reference to patient being discharged this date. Lewis NP also evaluated patient and cleared from psychiatry. Lewis NP informed EDP of disposition.

## 2019-05-31 NOTE — ED Notes (Signed)
TTS completed. 

## 2019-05-31 NOTE — ED Provider Notes (Signed)
Patient is awake, alert, in no acute distress.  Is not intoxicated.  Patient states he might have been suicidal yesterday but he is intoxicated and does not know.  He is not suicidal now.  Psych has cleared for discharge.  We will replete potassium and give outpatient resources.   Pricilla Loveless, MD 05/31/19 1455

## 2019-05-31 NOTE — ED Notes (Signed)
Pt given water per request and couple packs of graham crackers for snack. Pt alert and oriented x 4. Pt calm and watching TV at this time.

## 2019-06-11 ENCOUNTER — Ambulatory Visit: Payer: BC Managed Care – PPO | Admitting: Internal Medicine

## 2019-06-13 ENCOUNTER — Encounter: Payer: Self-pay | Admitting: Internal Medicine

## 2019-06-13 ENCOUNTER — Emergency Department (HOSPITAL_COMMUNITY)
Admission: EM | Admit: 2019-06-13 | Discharge: 2019-06-13 | Disposition: A | Discharge status: Home / Self Care | Payer: BC Managed Care – PPO | Attending: Emergency Medicine | Admitting: Emergency Medicine

## 2019-06-13 ENCOUNTER — Encounter (HOSPITAL_COMMUNITY): Payer: Self-pay

## 2019-06-13 ENCOUNTER — Emergency Department (HOSPITAL_COMMUNITY): Payer: BC Managed Care – PPO

## 2019-06-13 ENCOUNTER — Other Ambulatory Visit: Payer: Self-pay

## 2019-06-13 DIAGNOSIS — Z7982 Long term (current) use of aspirin: Secondary | ICD-10-CM | POA: Insufficient documentation

## 2019-06-13 DIAGNOSIS — W010XXA Fall on same level from slipping, tripping and stumbling without subsequent striking against object, initial encounter: Secondary | ICD-10-CM | POA: Diagnosis not present

## 2019-06-13 DIAGNOSIS — Y929 Unspecified place or not applicable: Secondary | ICD-10-CM | POA: Diagnosis not present

## 2019-06-13 DIAGNOSIS — Y939 Activity, unspecified: Secondary | ICD-10-CM | POA: Insufficient documentation

## 2019-06-13 DIAGNOSIS — M25562 Pain in left knee: Secondary | ICD-10-CM | POA: Insufficient documentation

## 2019-06-13 DIAGNOSIS — Y999 Unspecified external cause status: Secondary | ICD-10-CM | POA: Diagnosis not present

## 2019-06-13 DIAGNOSIS — I10 Essential (primary) hypertension: Secondary | ICD-10-CM | POA: Diagnosis not present

## 2019-06-13 DIAGNOSIS — Z79899 Other long term (current) drug therapy: Secondary | ICD-10-CM | POA: Insufficient documentation

## 2019-06-13 DIAGNOSIS — M25561 Pain in right knee: Secondary | ICD-10-CM | POA: Diagnosis present

## 2019-06-13 DIAGNOSIS — W19XXXA Unspecified fall, initial encounter: Secondary | ICD-10-CM

## 2019-06-13 LAB — BASIC METABOLIC PANEL
Anion gap: 12 (ref 5–15)
BUN: 8 mg/dL (ref 6–20)
CO2: 24 mmol/L (ref 22–32)
Calcium: 9.1 mg/dL (ref 8.9–10.3)
Chloride: 107 mmol/L (ref 98–111)
Creatinine, Ser: 1.3 mg/dL — ABNORMAL HIGH (ref 0.61–1.24)
GFR calc Af Amer: >60 mL/min (ref >60)
GFR calc non Af Amer: >60 mL/min (ref >60)
Glucose, Bld: 110 mg/dL — ABNORMAL HIGH (ref 70–99)
Potassium: 4.4 mmol/L (ref 3.5–5.1)
Sodium: 143 mmol/L (ref 135–145)

## 2019-06-13 LAB — CBC
HCT: 37.7 % — ABNORMAL LOW (ref 39.0–52.0)
Hemoglobin: 11.9 g/dL — ABNORMAL LOW (ref 13.0–17.0)
MCH: 30.1 pg (ref 26.0–34.0)
MCHC: 31.6 g/dL (ref 30.0–36.0)
MCV: 95.2 fL (ref 80.0–100.0)
Platelets: 334 10*3/uL (ref 150–400)
RBC: 3.96 MIL/uL — ABNORMAL LOW (ref 4.22–5.81)
RDW: 15.6 % — ABNORMAL HIGH (ref 11.5–15.5)
WBC: 8.4 10*3/uL (ref 4.0–10.5)
nRBC: 0 % (ref 0.0–0.2)

## 2019-06-13 MED ORDER — CLONIDINE HCL 0.2 MG PO TABS
0.2000 mg | ORAL_TABLET | Freq: Once | ORAL | Status: AC
  Administered 2019-06-13: 11:00:00 0.2 mg via ORAL
  Filled 2019-06-13: qty 1

## 2019-06-13 MED ORDER — METOPROLOL TARTRATE 5 MG/5ML IV SOLN
5.0000 mg | Freq: Once | INTRAVENOUS | Status: AC
  Administered 2019-06-13: 5 mg via INTRAVENOUS
  Filled 2019-06-13: qty 5

## 2019-06-13 MED ORDER — SPIRONOLACTONE 25 MG PO TABS
25.0000 mg | ORAL_TABLET | Freq: Every day | ORAL | Status: DC
  Administered 2019-06-13: 25 mg via ORAL
  Filled 2019-06-13 (×5): qty 1

## 2019-06-13 MED ORDER — DOUBLE ANTIBIOTIC 500-10000 UNIT/GM EX OINT
TOPICAL_OINTMENT | Freq: Once | CUTANEOUS | Status: AC
  Filled 2019-06-13: qty 1

## 2019-06-13 MED ORDER — CHLORTHALIDONE 25 MG PO TABS
25.0000 mg | ORAL_TABLET | Freq: Every day | ORAL | Status: DC
  Administered 2019-06-13: 25 mg via ORAL
  Filled 2019-06-13 (×5): qty 1

## 2019-06-13 MED ORDER — ACETAMINOPHEN 325 MG PO TABS
650.0000 mg | ORAL_TABLET | Freq: Once | ORAL | Status: AC
  Administered 2019-06-13: 11:00:00 650 mg via ORAL
  Filled 2019-06-13: qty 2

## 2019-06-13 NOTE — ED Provider Notes (Signed)
[facility] EMERGENCY DEPARTMENT Provider Note   CSN: [id number] Arrival date & time: 06/13/19  2248     History Chief Complaint  Patient presents with  . Fall  . Knee Pain    Gabriel Zavala is a 54 y.o. male presenting for evaluation of bilateral knee pain.  Patient states last night he was drunk and he fell forward, landing on both knees.  He reports acute onset knee pain which has been present throughout last night and today.  He has not taken anything for this including Tylenol ibuprofen.  He has been able to ambulate, but reports worsening pain with ambulation.  He denies hitting his head or loss of consciousness.  He denies headache, neck pain, back pain, chest pain, shortness breath, nausea, vomiting, or pelvis pain.  He denies numbness or tingling.  He is not on blood thinners.  Patient states he has not taken any of his medications today, including his blood pressure medicines.  HPI     Past Medical History:  Diagnosis Date  . Gout   . History of nuclear stress test 06/2017   "No evidence of pharmacologically induced myocardial ischemia or prior MI"  . Hypercholesteremia   . Hypertension   . Seizure (HCC) 10/16/2018    Patient Active Problem List   Diagnosis Date Noted  . Non-intractable vomiting   . Alcohol dependence with uncomplicated withdrawal (HCC) 05/15/2019  . Acute pain of right knee   . Metabolic acidosis 05/14/2019  . Lactic acidosis 05/14/2019  . Hypertensive urgency 05/14/2019  . Hypomagnesemia 05/14/2019  . Alcohol use 05/14/2019  . Impaired glucose tolerance 05/14/2019  . Hypercholesteremia   . Gout   . Alcohol abuse   . Dehydration   . Seizure (HCC) 10/16/2018  . AKI (acute kidney injury) (HCC) 10/16/2018  . Hypokalemia 10/16/2018  . Elevated bilirubin 10/16/2018  . Leukocytosis 10/16/2018  . TIA (transient ischemic attack) 10/16/2018  . Hyperglycemia 10/16/2018    Past Surgical History:  Procedure Laterality Date  . HERNIA  REPAIR         Family History  Problem Relation Age of Onset  . Hypertension Mother   . Hypertension Sister   . Hypertension Brother   . Seizures Brother        Secondary to TBI  . Hypertension Sister     Social History   Tobacco Use  . Smoking status: Never Smoker  . Smokeless tobacco: Never Used  Substance Use Topics  . Alcohol use: Yes    Alcohol/week: 10.0 standard drinks    Types: 5 Glasses of wine, 5 Cans of beer per week    Comment: 5th on days ofrf  . Drug use: Not Currently    Home Medications Prior to Admission medications   Medication Sig Start Date End Date Taking? Authorizing Provider  allopurinol (ZYLOPRIM) 300 MG tablet Take 300 mg by mouth daily.     [provider]  aspirin EC 81 MG tablet Take 81 mg by mouth daily.    [provider]  celecoxib (CELEBREX) 100 MG capsule Take 1 capsule (100 mg total) by mouth 2 (two) times daily. 05/18/19   Arthor Captain, PA-C  chlorthalidone (HYGROTON) 25 MG tablet Take 1 tablet (25 mg total) by mouth daily. 05/18/19   Catarina Hartshorn, MD  cloNIDine (CATAPRES) 0.2 MG tablet Take 1 tablet (0.2 mg total) by mouth 2 (two) times daily. 05/17/19   Catarina Hartshorn, MD  diphenoxylate-atropine (LOMOTIL) 2.5-0.025 MG tablet Take 1 tablet by mouth  daily.    [provider]  fenofibrate (TRICOR) 145 MG tablet Take 145 mg by mouth daily.     [provider]  MESTINON 60 MG tablet Take 30 mg by mouth 3 (three) times daily. 05/06/19   [provider]  metoprolol succinate (TOPROL-XL) 200 MG 24 hr tablet Take 1 tablet (200 mg total) by mouth daily. Take with or immediately following a meal. 05/17/19   Tat, Shanon Brow, MD  Multiple Vitamin (MULTIVITAMIN WITH MINERALS) TABS tablet Take 1 tablet by mouth daily.    [provider]  potassium chloride SA (KLOR-CON) 20 MEQ tablet Take 1 tablet (20 mEq total) by mouth daily for 4 days. 05/31/19 06/04/19  Sherwood Gambler, MD  spironolactone (ALDACTONE) 25 MG  tablet Take 1 tablet (25 mg total) by mouth daily. 05/17/19   Orson Eva, MD  traZODone (DESYREL) 100 MG tablet Take 100 mg by mouth at bedtime. 12/20/18   [provider]    Allergies    Patient has no known allergies.  Review of Systems   Review of Systems  Musculoskeletal: Positive for arthralgias.  All other systems reviewed and are negative.   Physical Exam Updated Vital Signs BP (!) 189/123   Pulse 96   Temp 98.4 F (36.9 C) (Oral)   Resp 16   Ht 5\' 8"  (1.727 m)   Wt 99 kg   SpO2 96%   BMI 33.19 kg/m   Physical Exam Vitals and nursing note reviewed.  Constitutional:      General: He is not in acute distress.    Appearance: He is well-developed.     Comments: Resting comfortably in the bed in no acute distress  HENT:     Head: Normocephalic and atraumatic.  Eyes:     Conjunctiva/sclera: Conjunctivae normal.     Pupils: Pupils are equal, round, and reactive to light.  Cardiovascular:     Rate and Rhythm: Regular rhythm. Tachycardia present.     Pulses: Normal pulses.     Comments: Tachycardic around 120 Pulmonary:     Effort: Pulmonary effort is normal. No respiratory distress.     Breath sounds: Normal breath sounds. No wheezing.  Abdominal:     General: There is no distension.     Palpations: Abdomen is soft. There is no mass.     Tenderness: There is no abdominal tenderness. There is no guarding or rebound.  Musculoskeletal:        General: Normal range of motion.     Cervical back: Normal range of motion and neck supple.     Comments: Tenderness palpation of anterior knees bilaterally.  Small abrasion of the right knee without active bleeding.  No tenderness palpation elsewhere the legs or lower extremities.  Pedal pulses 2+ bilaterally. No tenderness to palpation of the pelvis, stable.  No tenderness palpation of back or midline spine.  Strength and sensation intact x4.  Skin:    General: Skin is warm and dry.     Capillary Refill: Capillary  refill takes less than 2 seconds.  Neurological:     Mental Status: He is alert and oriented to person, place, and time.     ED Results / Procedures / Treatments   Labs (all labs ordered are listed, but only abnormal results are displayed) Labs Reviewed  BASIC METABOLIC PANEL - Abnormal; Notable for the following components:      Result Value   Glucose, Bld 110 (*)    Creatinine, Ser 1.30 (*)  All other components within normal limits  CBC - Abnormal; Notable for the following components:   RBC 3.96 (*)    Hemoglobin 11.9 (*)    HCT 37.7 (*)    RDW 15.6 (*)    All other components within normal limits    EKG None  Radiology DG Knee Complete 4 Views Left  Result Date: 06/13/2019 CLINICAL DATA:  Bilateral anterior knee pain post fall EXAM: LEFT KNEE - COMPLETE 4+ VIEW COMPARISON:  05/18/2019 FINDINGS: Mild degenerative changes with mild joint space narrowing medial compartment. Irregularity of the tibial tuberosity with well corticated margins likely chronic, potentially associated with Osgood-Schlatter's, associated with mild thickening of the patellar tendon at the insertion. Mild soft tissue swelling. No joint effusion. IMPRESSION: 1. No acute fracture or dislocation. 2. Mild soft tissue swelling. 3. Mild degenerative changes. 4. 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 Electronically Signed   By: Donzetta Kohut M.D.   On: 06/13/2019 10:09   DG Knee Complete 4 Views Right  Result Date: 06/13/2019 CLINICAL DATA:  Fall.  Bilateral anterior knee pain. EXAM: RIGHT KNEE - COMPLETE 4+ VIEW COMPARISON:  None. FINDINGS: No evidence of fracture, dislocation, or joint effusion. No evidence of arthropathy or other focal bone abnormality. Soft tissues are unremarkable. IMPRESSION: Negative right knee radiographs. Electronically Signed   By: Marin Roberts M.D.   On: 06/13/2019 10:07    Procedures Procedures (including critical care time)  Medications Ordered in ED Medications  cloNIDine  (CATAPRES) tablet 0.2 mg (0.2 mg Oral Given 06/13/19 1043)  metoprolol tartrate (LOPRESSOR) injection 5 mg (5 mg Intravenous Given 06/13/19 1041)  acetaminophen (TYLENOL) tablet 650 mg (650 mg Oral Given 06/13/19 1128)  polymixin-bacitracin (POLYSPORIN) ointment ( Topical Given 06/13/19 1137)    ED Course  I have reviewed the triage vital signs and the nursing notes.  Pertinent labs & imaging results that were available during my care of the patient were reviewed by me and considered in my medical decision making (see chart for details).    MDM Rules/Calculators/A&P                      Patient presenting for evaluation of bilateral knee pain after fall last night.  Physical exam reassuring, he appears nontoxic.  No significant signs of trauma, he does have a small abrasion of the anterior knee.  Likely contusion/MSK trauma, however will obtain x-rays to rule out fracture dislocation.  Of note, patient is very hypertensive and tachycardic.  This is likely because he has not had his medications yet today.  He states his last round of any medication was yesterday morning.  Will give his home medications and reassess.  X-rays read interpreted by me, no fracture dislocation.  Patient with chronic degenerative changes.  Heart rate and blood pressure improving with home medications as expected.  Discussed findings and plan with patient.  Discussed importance of taking the medication.  At this time, patient appears safe for discharge.  Return precautions given.  Patient states he understands and agrees to plan.  Final Clinical Impression(s) / ED Diagnoses Final diagnoses:  Fall, initial encounter  Acute pain of both knees  Hypertension, unspecified type    Rx / DC Orders ED Discharge Orders    None       Alveria Apley, PA-C 06/13/19 1153    Mancel Bale, MD 06/16/19 1458

## 2019-06-13 NOTE — ED Triage Notes (Addendum)
Pt brought in by EMS due to bilateral knee pain. Pt had been drinking and got up to go to BR and fell on knees.  BP 220/141. Pt has not taken BP meds today

## 2019-06-13 NOTE — Discharge Instructions (Addendum)
Do not take your spironolactone or chlorthalidone, as you already received those in the ER this morning. Take your metoprolol this morning as prescribed.  Take your Catapres this afternoon as prescribed (do not take the morning dose).  Take Tylenol and ibuprofen as needed for pain. Follow-up with your primary care doctor as needed for further evaluation of your knee pain and blood pressure. Return to the emergency room with any new, worsening, or concerning symptoms.

## 2019-07-17 DEATH — deceased

## 2020-08-10 IMAGING — DX DG CHEST 1V PORT
1 series · 1 of 1 positions shown · non-contrast
Comparison: 03/25/2019

CLINICAL DATA: Vomiting

EXAM:
PORTABLE CHEST 1 VIEW

[chest ap grid]
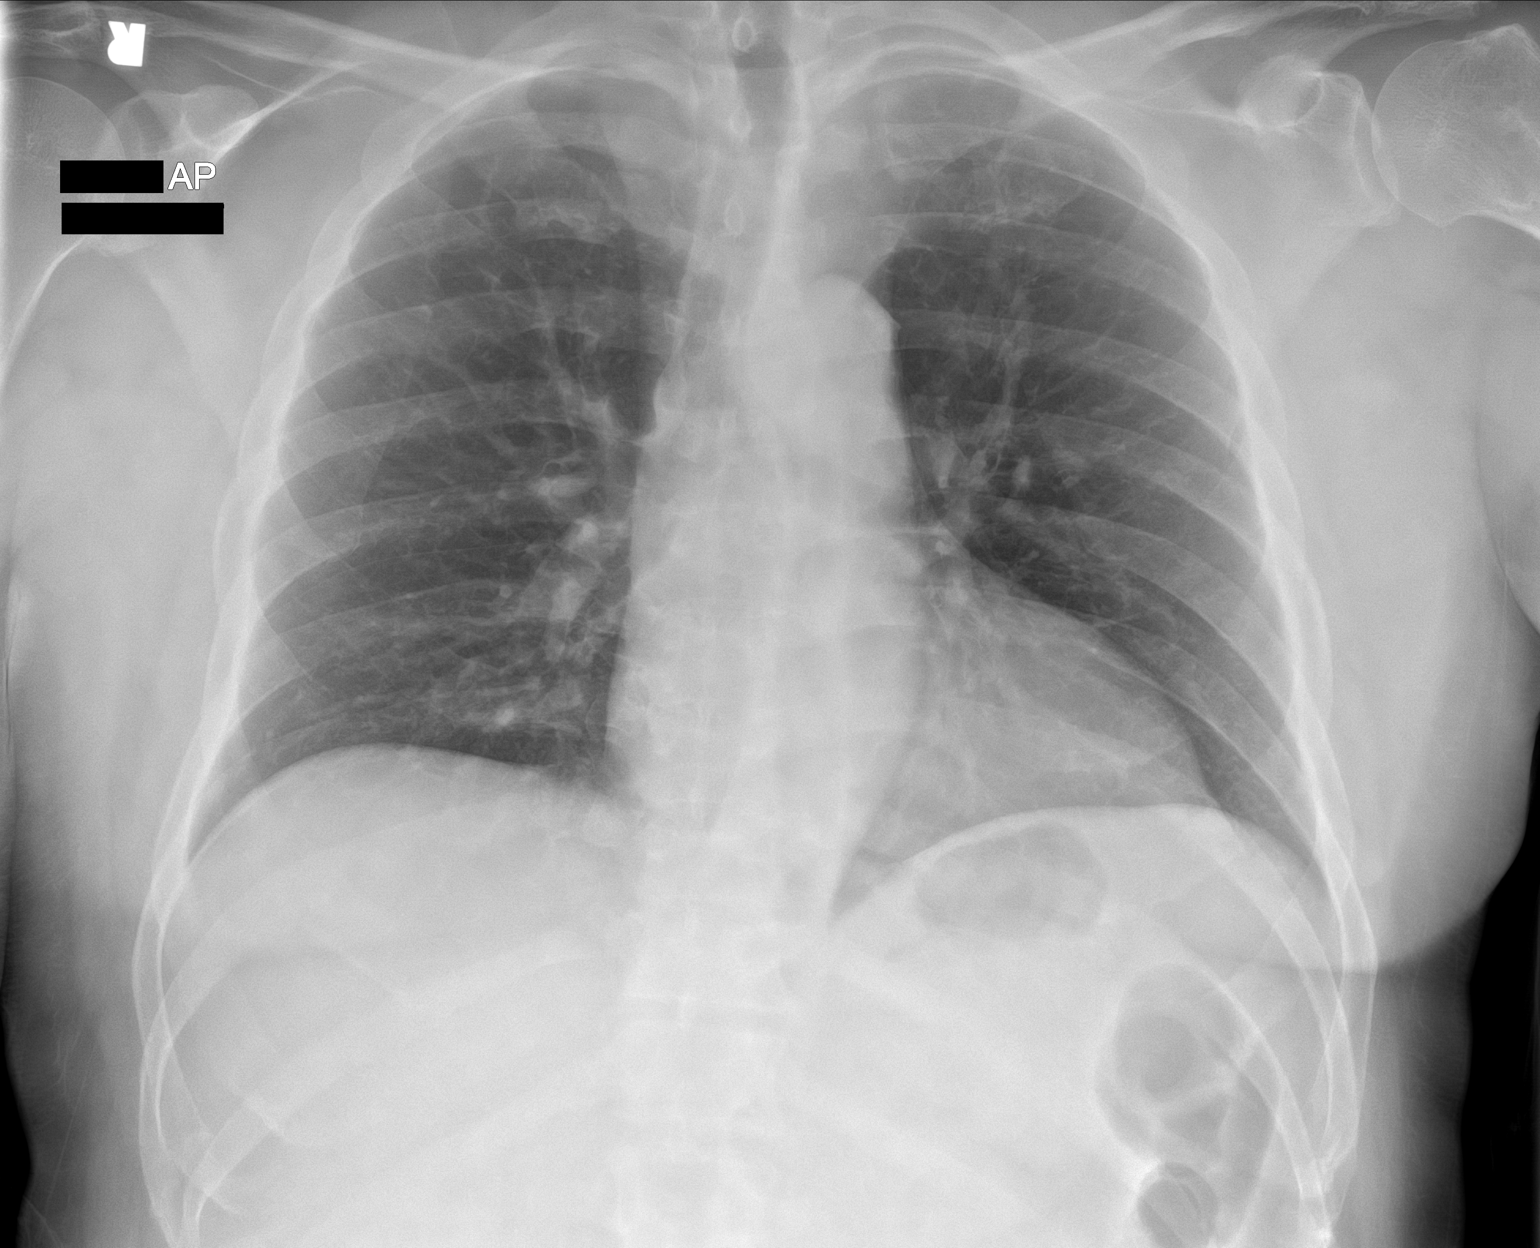

[1 of 1 positions shown; findings below may reference images not displayed]

FINDINGS: The heart size and mediastinal contours are within normal limits.
Both lungs are clear. The visualized skeletal structures are
unremarkable.
IMPRESSION: No active disease.

## 2020-09-10 IMAGING — DX DG KNEE COMPLETE 4+V*R*
4 series · 4 of 4 positions shown · non-contrast
Comparison: None.

CLINICAL DATA: Fall.  Bilateral anterior knee pain.

EXAM:
RIGHT KNEE - COMPLETE 4+ VIEW

[knee ap]
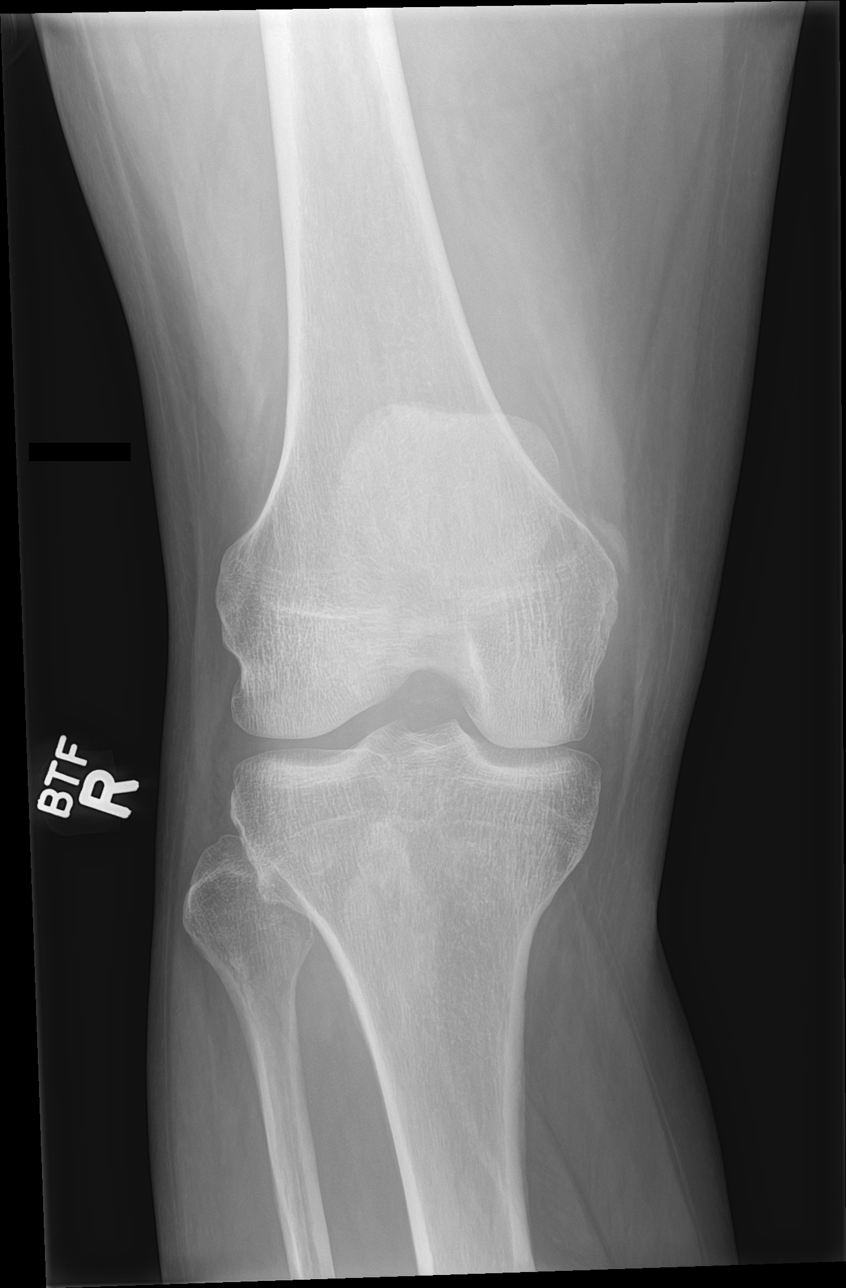

[knee lat]
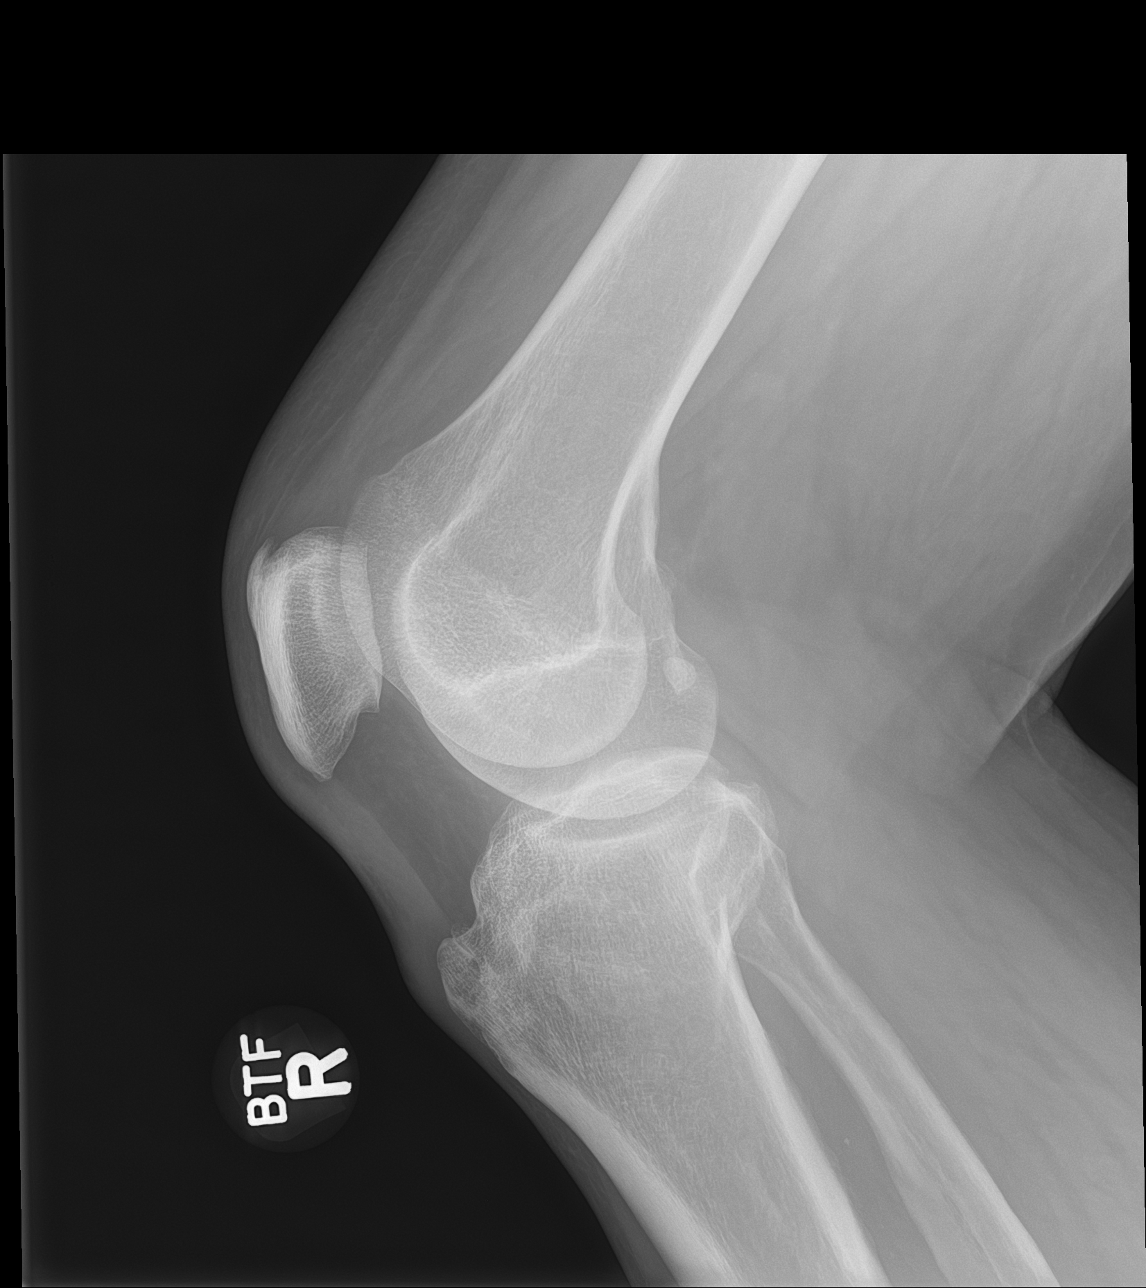

[knee obl (1 of 2)]
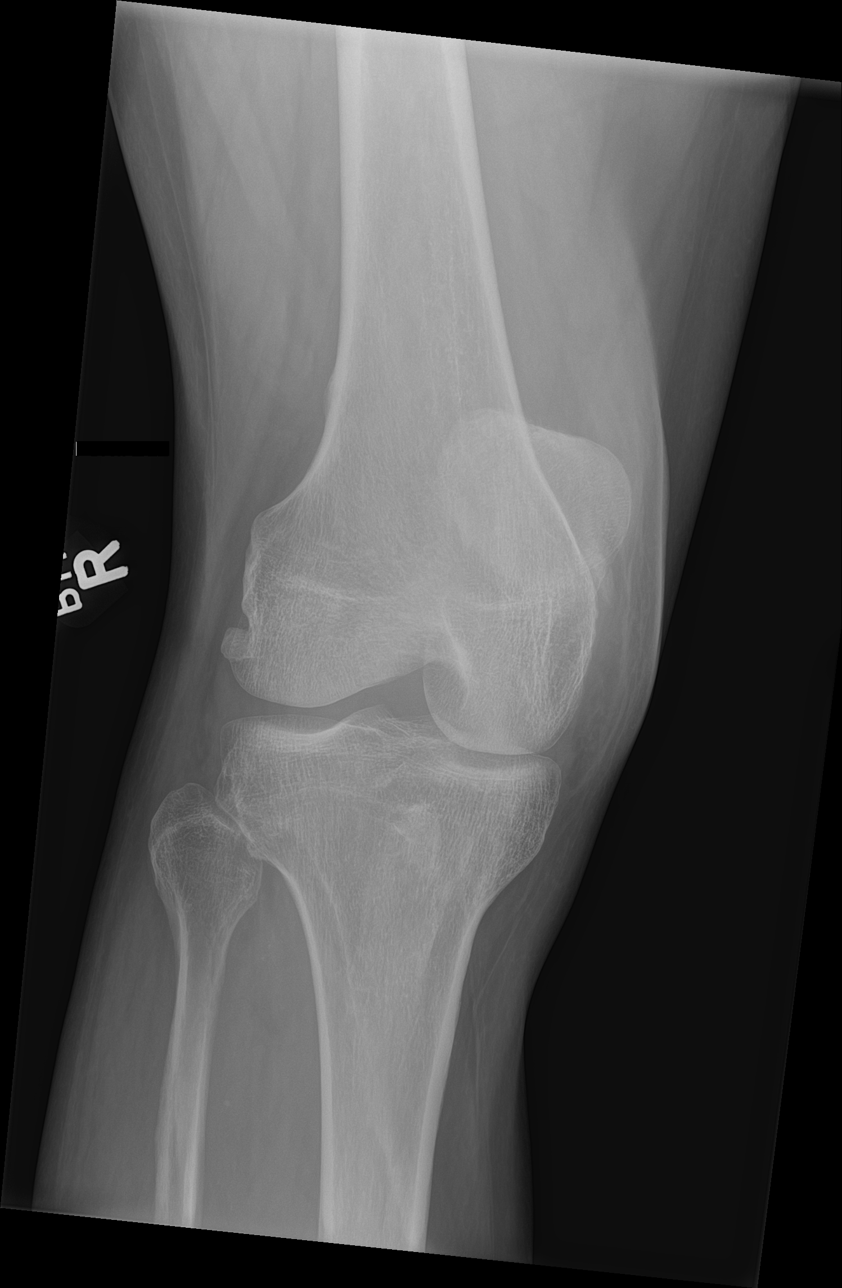

[knee obl (2 of 2)]
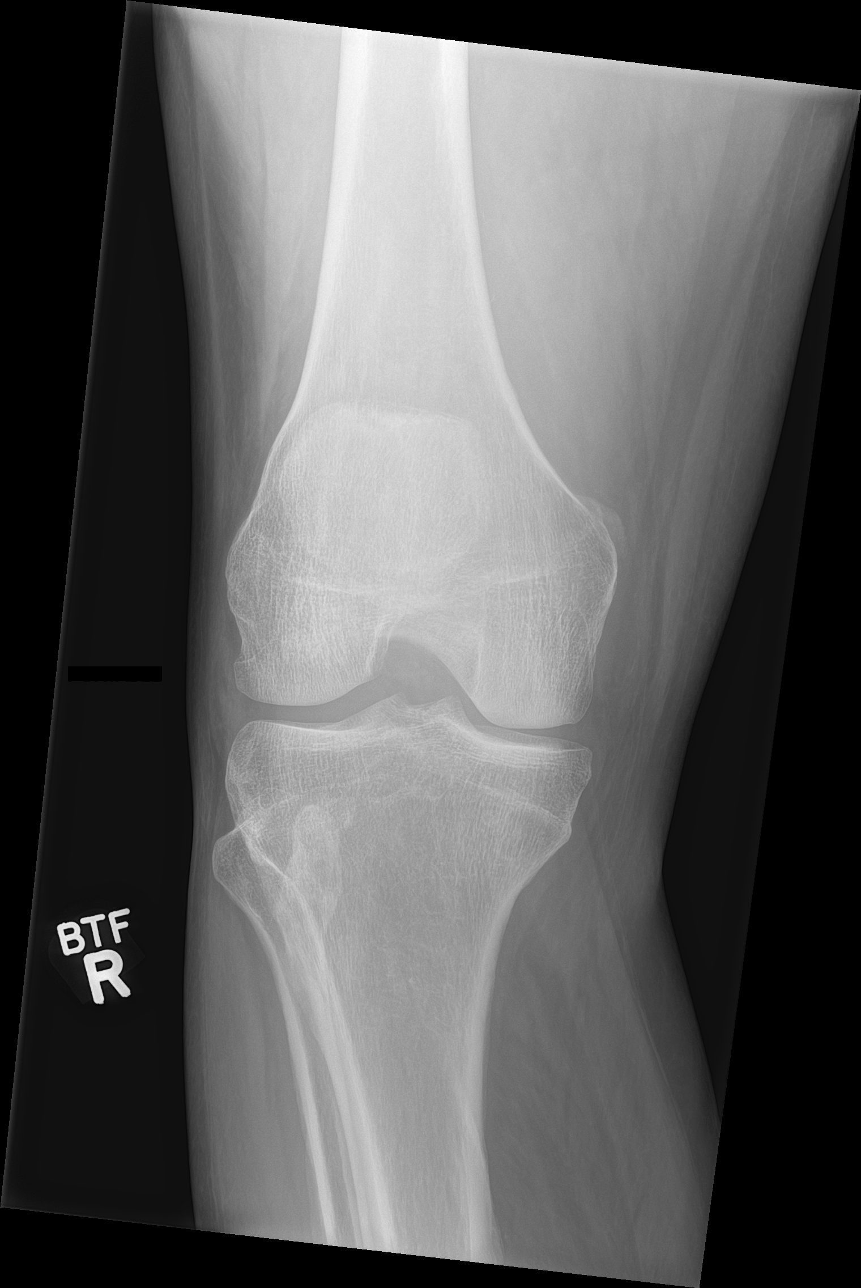

[4 of 4 positions shown; findings below may reference images not displayed]

FINDINGS: No evidence of fracture, dislocation, or joint effusion. No evidence
of arthropathy or other focal bone abnormality. Soft tissues are
unremarkable.
IMPRESSION: Negative right knee radiographs.

## 2022-03-17 ENCOUNTER — Ambulatory Visit: Payer: Self-pay | Admitting: Student
